# Patient Record
Sex: Female | Born: 1940 | Race: Black or African American | Hispanic: No | Marital: Married | Smoking: Never smoker
Health system: Southern US, Community
[De-identification: ages and names within clinical notes are randomized; demographics above are authoritative.]

## PROBLEM LIST (undated history)

## (undated) DIAGNOSIS — I619 Nontraumatic intracerebral hemorrhage, unspecified: Secondary | ICD-10-CM

## (undated) DIAGNOSIS — I10 Essential (primary) hypertension: Secondary | ICD-10-CM

## (undated) DIAGNOSIS — I4891 Unspecified atrial fibrillation: Secondary | ICD-10-CM

## (undated) HISTORY — PX: HEMORROIDECTOMY: SUR656

## (undated) HISTORY — PX: CARDIOVERSION: SHX1299

## (undated) HISTORY — PX: BREAST SURGERY: SHX581

---

## 2008-11-15 ENCOUNTER — Ambulatory Visit: Payer: Self-pay | Admitting: Diagnostic Radiology

## 2008-11-15 ENCOUNTER — Emergency Department (HOSPITAL_BASED_OUTPATIENT_CLINIC_OR_DEPARTMENT_OTHER): Admission: EM | Admit: 2008-11-15 | Discharge: 2008-11-15 | Payer: Self-pay | Admitting: Emergency Medicine

## 2009-03-13 IMAGING — CR DG HAND COMPLETE 3+V*R*
3 series · 3 of 3 positions shown · non-contrast
Comparison: None

CLINICAL DATA: Dog bite, lacerations, swelling

RIGHT HAND - COMPLETE 3+ VIEW

[x hand pa right]
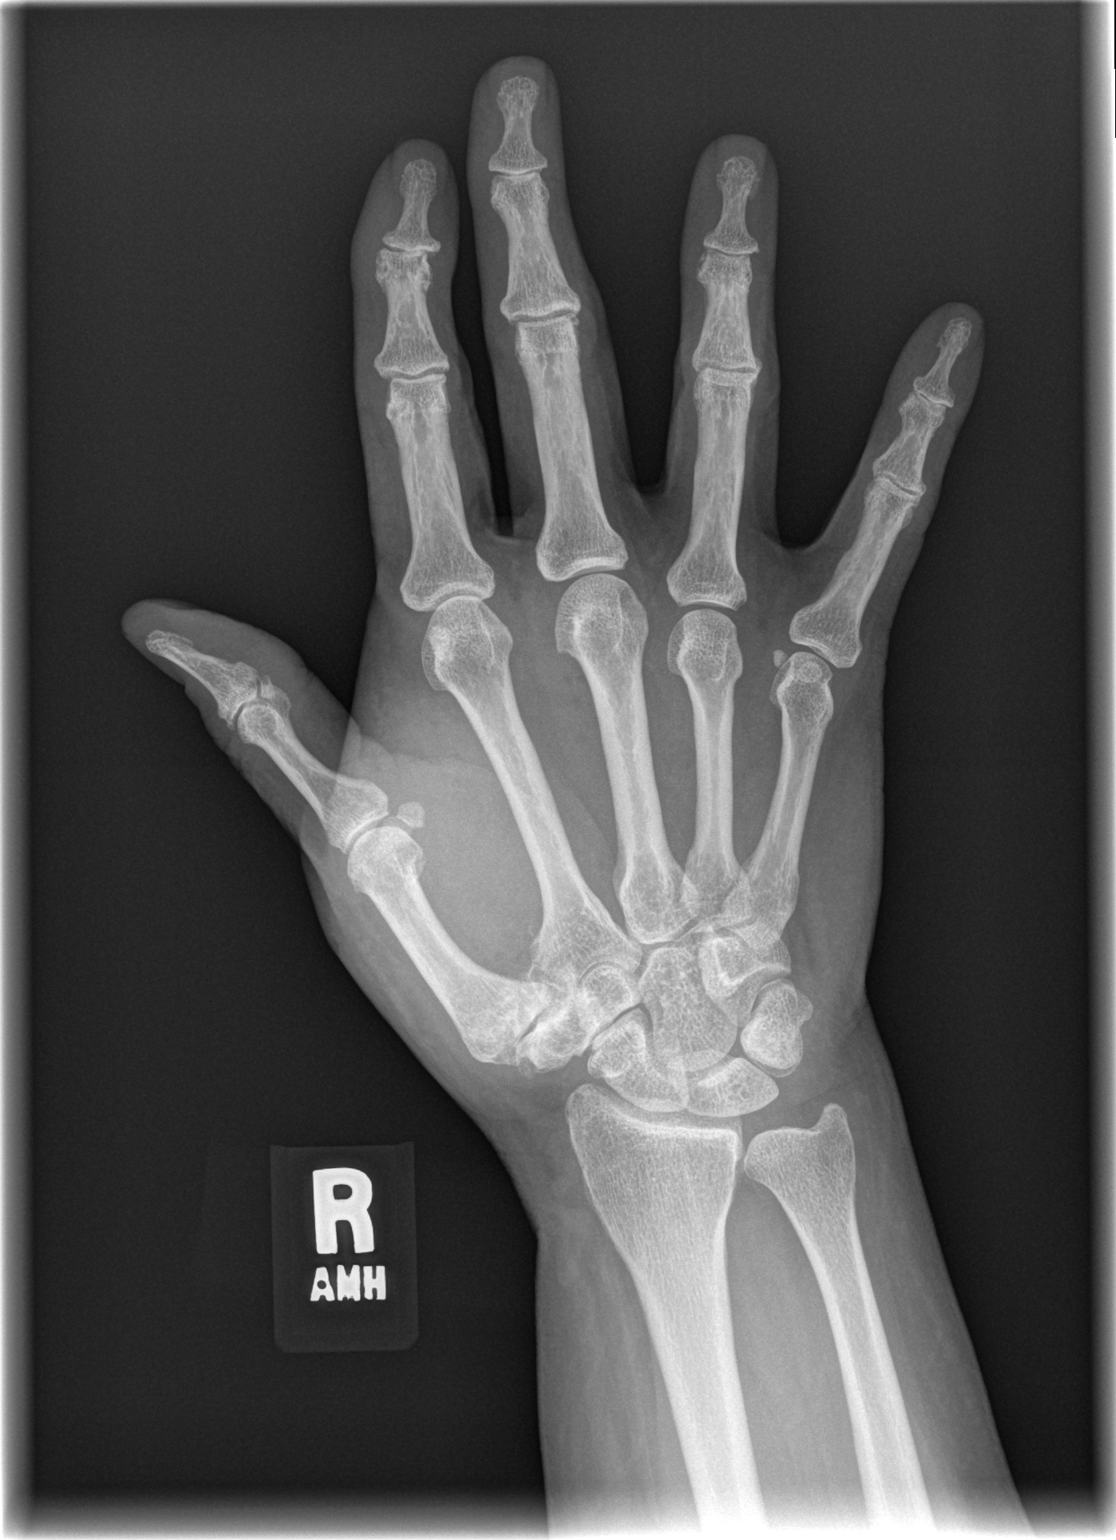

[x hand oblique right]
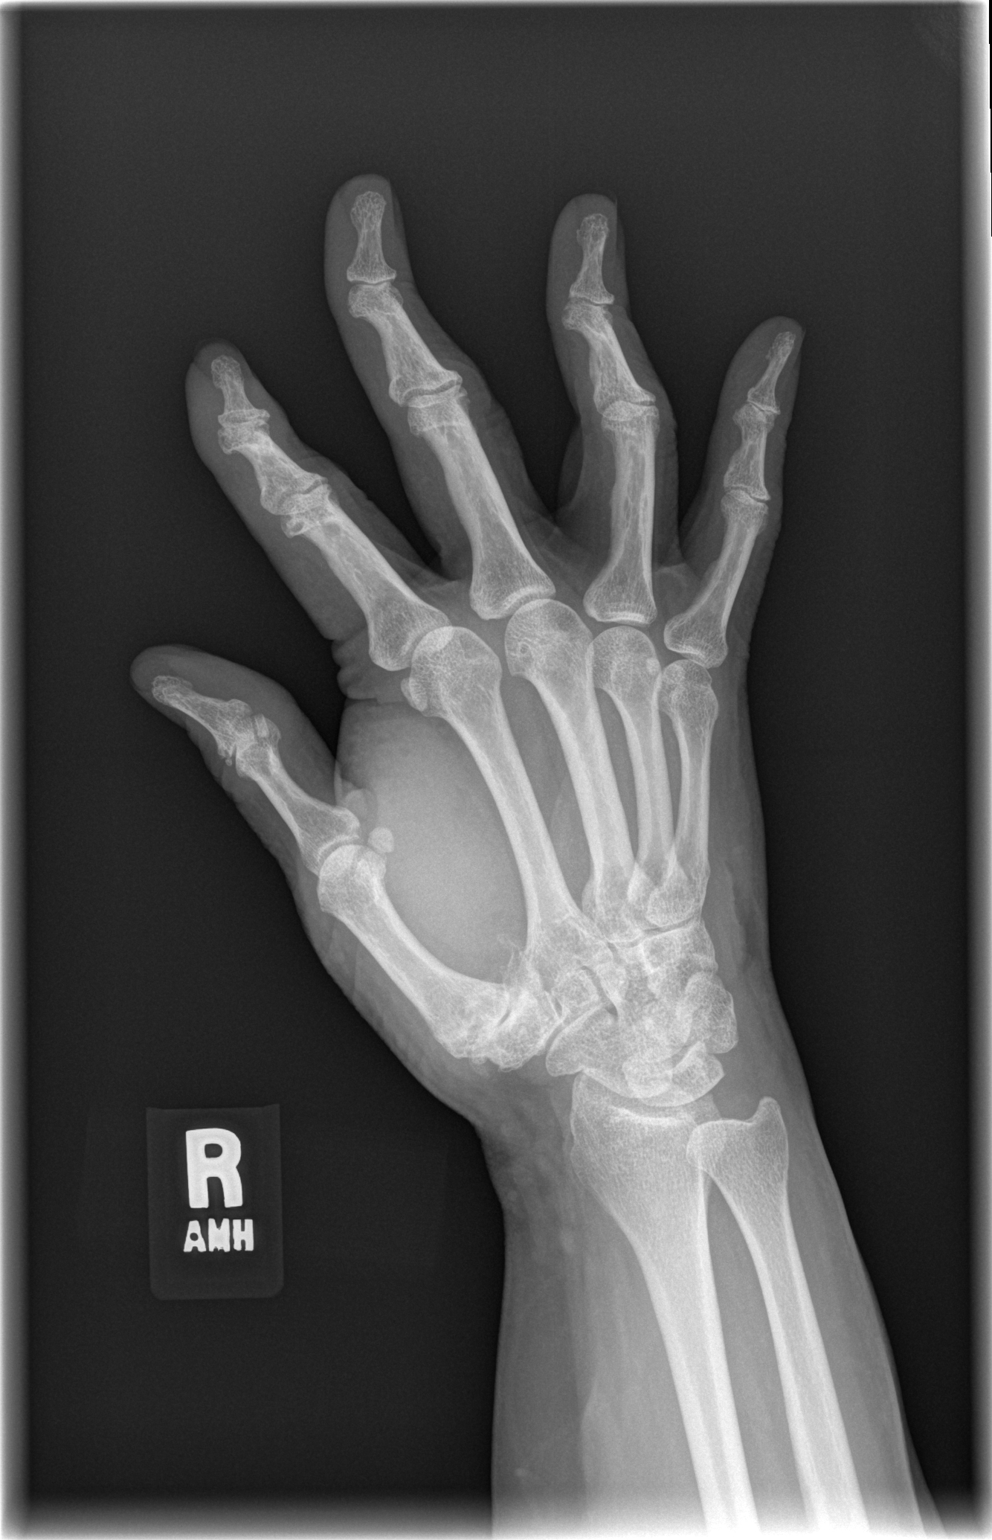

[x hand lat right]
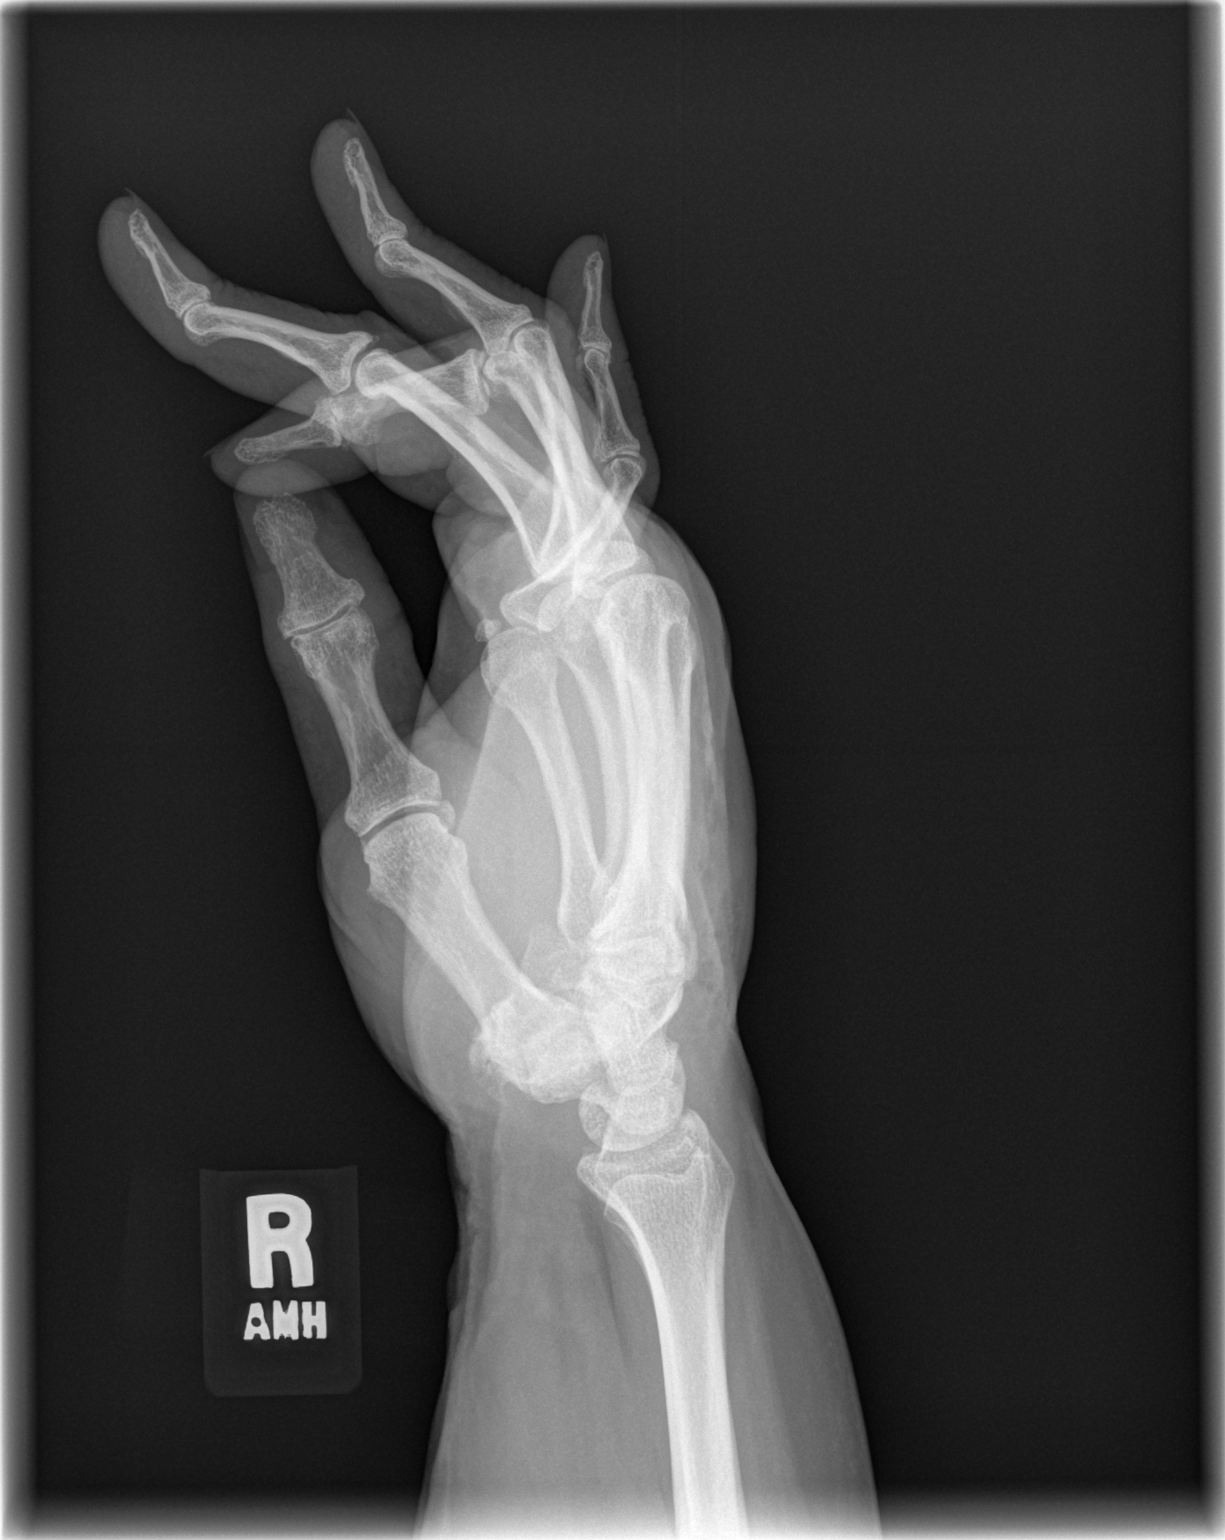

[3 of 3 positions shown; findings below may reference images not displayed]

FINDINGS: Scattered degenerative changes of interphalangeal joints.
Additional degenerative changes of first CMC joint with slight
radial subluxation of first metacarpal.
Slight bony demineralization.
Soft tissue swelling and foci of gas along dorsum of hand at the
level of the proximal metacarpals and CMC joints.
No acute fracture, dislocation, or bone destruction.
IMPRESSION: Soft tissue injuries at dorsum of right hand.
No definite acute bony abnormalities.
Scattered degenerative changes right hand as above.

## 2015-12-03 DIAGNOSIS — I10 Essential (primary) hypertension: Secondary | ICD-10-CM | POA: Diagnosis not present

## 2015-12-08 DIAGNOSIS — E039 Hypothyroidism, unspecified: Secondary | ICD-10-CM | POA: Diagnosis not present

## 2015-12-08 DIAGNOSIS — I4891 Unspecified atrial fibrillation: Secondary | ICD-10-CM | POA: Diagnosis not present

## 2015-12-08 DIAGNOSIS — I10 Essential (primary) hypertension: Secondary | ICD-10-CM | POA: Diagnosis not present

## 2015-12-21 DIAGNOSIS — I4891 Unspecified atrial fibrillation: Secondary | ICD-10-CM | POA: Diagnosis not present

## 2015-12-21 DIAGNOSIS — I495 Sick sinus syndrome: Secondary | ICD-10-CM | POA: Diagnosis not present

## 2015-12-21 DIAGNOSIS — I10 Essential (primary) hypertension: Secondary | ICD-10-CM | POA: Diagnosis not present

## 2015-12-21 DIAGNOSIS — I447 Left bundle-branch block, unspecified: Secondary | ICD-10-CM | POA: Diagnosis not present

## 2015-12-21 DIAGNOSIS — I480 Paroxysmal atrial fibrillation: Secondary | ICD-10-CM | POA: Diagnosis not present

## 2015-12-21 DIAGNOSIS — G4733 Obstructive sleep apnea (adult) (pediatric): Secondary | ICD-10-CM | POA: Diagnosis not present

## 2015-12-21 DIAGNOSIS — I517 Cardiomegaly: Secondary | ICD-10-CM | POA: Diagnosis not present

## 2015-12-21 DIAGNOSIS — Z6839 Body mass index (BMI) 39.0-39.9, adult: Secondary | ICD-10-CM | POA: Diagnosis not present

## 2015-12-22 DIAGNOSIS — G4733 Obstructive sleep apnea (adult) (pediatric): Secondary | ICD-10-CM | POA: Diagnosis not present

## 2015-12-22 DIAGNOSIS — I480 Paroxysmal atrial fibrillation: Secondary | ICD-10-CM | POA: Diagnosis not present

## 2015-12-22 DIAGNOSIS — R42 Dizziness and giddiness: Secondary | ICD-10-CM | POA: Diagnosis not present

## 2015-12-22 DIAGNOSIS — Z79899 Other long term (current) drug therapy: Secondary | ICD-10-CM | POA: Diagnosis not present

## 2015-12-22 DIAGNOSIS — T462X5A Adverse effect of other antidysrhythmic drugs, initial encounter: Secondary | ICD-10-CM | POA: Diagnosis not present

## 2015-12-22 DIAGNOSIS — I10 Essential (primary) hypertension: Secondary | ICD-10-CM | POA: Diagnosis not present

## 2015-12-22 DIAGNOSIS — E039 Hypothyroidism, unspecified: Secondary | ICD-10-CM | POA: Diagnosis not present

## 2015-12-22 DIAGNOSIS — Z6839 Body mass index (BMI) 39.0-39.9, adult: Secondary | ICD-10-CM | POA: Diagnosis not present

## 2015-12-22 DIAGNOSIS — F329 Major depressive disorder, single episode, unspecified: Secondary | ICD-10-CM | POA: Diagnosis not present

## 2015-12-22 DIAGNOSIS — Z9989 Dependence on other enabling machines and devices: Secondary | ICD-10-CM | POA: Diagnosis not present

## 2015-12-22 DIAGNOSIS — I4891 Unspecified atrial fibrillation: Secondary | ICD-10-CM | POA: Diagnosis not present

## 2015-12-22 DIAGNOSIS — I081 Rheumatic disorders of both mitral and tricuspid valves: Secondary | ICD-10-CM | POA: Diagnosis not present

## 2015-12-22 DIAGNOSIS — Z7902 Long term (current) use of antithrombotics/antiplatelets: Secondary | ICD-10-CM | POA: Diagnosis not present

## 2015-12-22 DIAGNOSIS — I517 Cardiomegaly: Secondary | ICD-10-CM | POA: Diagnosis not present

## 2015-12-22 DIAGNOSIS — M1990 Unspecified osteoarthritis, unspecified site: Secondary | ICD-10-CM | POA: Diagnosis not present

## 2015-12-22 DIAGNOSIS — Z881 Allergy status to other antibiotic agents status: Secondary | ICD-10-CM | POA: Diagnosis not present

## 2015-12-22 DIAGNOSIS — R413 Other amnesia: Secondary | ICD-10-CM | POA: Diagnosis not present

## 2015-12-23 DIAGNOSIS — Z6839 Body mass index (BMI) 39.0-39.9, adult: Secondary | ICD-10-CM | POA: Diagnosis not present

## 2015-12-23 DIAGNOSIS — G4733 Obstructive sleep apnea (adult) (pediatric): Secondary | ICD-10-CM | POA: Diagnosis not present

## 2015-12-23 DIAGNOSIS — I480 Paroxysmal atrial fibrillation: Secondary | ICD-10-CM | POA: Diagnosis not present

## 2015-12-23 DIAGNOSIS — I10 Essential (primary) hypertension: Secondary | ICD-10-CM | POA: Diagnosis not present

## 2015-12-24 DIAGNOSIS — Z6841 Body Mass Index (BMI) 40.0 and over, adult: Secondary | ICD-10-CM | POA: Diagnosis not present

## 2015-12-24 DIAGNOSIS — R001 Bradycardia, unspecified: Secondary | ICD-10-CM | POA: Diagnosis not present

## 2015-12-24 DIAGNOSIS — G4733 Obstructive sleep apnea (adult) (pediatric): Secondary | ICD-10-CM | POA: Diagnosis not present

## 2015-12-24 DIAGNOSIS — I4891 Unspecified atrial fibrillation: Secondary | ICD-10-CM | POA: Diagnosis not present

## 2015-12-24 DIAGNOSIS — I10 Essential (primary) hypertension: Secondary | ICD-10-CM | POA: Diagnosis not present

## 2015-12-30 DIAGNOSIS — Z79899 Other long term (current) drug therapy: Secondary | ICD-10-CM | POA: Diagnosis not present

## 2015-12-30 DIAGNOSIS — I10 Essential (primary) hypertension: Secondary | ICD-10-CM | POA: Diagnosis not present

## 2015-12-30 DIAGNOSIS — I4891 Unspecified atrial fibrillation: Secondary | ICD-10-CM | POA: Diagnosis not present

## 2015-12-30 DIAGNOSIS — I480 Paroxysmal atrial fibrillation: Secondary | ICD-10-CM | POA: Diagnosis not present

## 2015-12-30 DIAGNOSIS — G4733 Obstructive sleep apnea (adult) (pediatric): Secondary | ICD-10-CM | POA: Diagnosis not present

## 2015-12-30 DIAGNOSIS — I252 Old myocardial infarction: Secondary | ICD-10-CM | POA: Diagnosis not present

## 2015-12-30 DIAGNOSIS — E039 Hypothyroidism, unspecified: Secondary | ICD-10-CM | POA: Diagnosis not present

## 2016-01-20 DIAGNOSIS — I4891 Unspecified atrial fibrillation: Secondary | ICD-10-CM | POA: Diagnosis not present

## 2016-01-20 DIAGNOSIS — I480 Paroxysmal atrial fibrillation: Secondary | ICD-10-CM | POA: Diagnosis not present

## 2016-01-20 DIAGNOSIS — R002 Palpitations: Secondary | ICD-10-CM | POA: Diagnosis not present

## 2016-01-20 DIAGNOSIS — R05 Cough: Secondary | ICD-10-CM | POA: Diagnosis not present

## 2016-01-20 DIAGNOSIS — I499 Cardiac arrhythmia, unspecified: Secondary | ICD-10-CM | POA: Diagnosis not present

## 2016-01-20 DIAGNOSIS — I10 Essential (primary) hypertension: Secondary | ICD-10-CM | POA: Diagnosis not present

## 2016-02-01 DIAGNOSIS — E02 Subclinical iodine-deficiency hypothyroidism: Secondary | ICD-10-CM | POA: Diagnosis not present

## 2016-02-01 DIAGNOSIS — I10 Essential (primary) hypertension: Secondary | ICD-10-CM | POA: Diagnosis not present

## 2016-02-01 DIAGNOSIS — Z6839 Body mass index (BMI) 39.0-39.9, adult: Secondary | ICD-10-CM | POA: Diagnosis not present

## 2016-02-01 DIAGNOSIS — G4733 Obstructive sleep apnea (adult) (pediatric): Secondary | ICD-10-CM | POA: Diagnosis not present

## 2016-02-01 DIAGNOSIS — I480 Paroxysmal atrial fibrillation: Secondary | ICD-10-CM | POA: Diagnosis not present

## 2016-02-29 DIAGNOSIS — E02 Subclinical iodine-deficiency hypothyroidism: Secondary | ICD-10-CM | POA: Diagnosis not present

## 2016-02-29 DIAGNOSIS — I4891 Unspecified atrial fibrillation: Secondary | ICD-10-CM | POA: Diagnosis not present

## 2016-02-29 DIAGNOSIS — G4733 Obstructive sleep apnea (adult) (pediatric): Secondary | ICD-10-CM | POA: Diagnosis not present

## 2016-02-29 DIAGNOSIS — I10 Essential (primary) hypertension: Secondary | ICD-10-CM | POA: Diagnosis not present

## 2016-02-29 DIAGNOSIS — Z6841 Body Mass Index (BMI) 40.0 and over, adult: Secondary | ICD-10-CM | POA: Diagnosis not present

## 2016-04-14 ENCOUNTER — Emergency Department (HOSPITAL_BASED_OUTPATIENT_CLINIC_OR_DEPARTMENT_OTHER)
Admission: EM | Admit: 2016-04-14 | Discharge: 2016-04-14 | Disposition: A | Discharge status: Home / Self Care | Payer: PPO | Attending: Emergency Medicine | Admitting: Emergency Medicine

## 2016-04-14 ENCOUNTER — Encounter (HOSPITAL_BASED_OUTPATIENT_CLINIC_OR_DEPARTMENT_OTHER): Payer: Self-pay | Admitting: *Deleted

## 2016-04-14 DIAGNOSIS — Z7901 Long term (current) use of anticoagulants: Secondary | ICD-10-CM | POA: Diagnosis not present

## 2016-04-14 DIAGNOSIS — R040 Epistaxis: Secondary | ICD-10-CM | POA: Diagnosis not present

## 2016-04-14 DIAGNOSIS — I10 Essential (primary) hypertension: Secondary | ICD-10-CM | POA: Diagnosis not present

## 2016-04-14 DIAGNOSIS — I4891 Unspecified atrial fibrillation: Secondary | ICD-10-CM | POA: Diagnosis not present

## 2016-04-14 HISTORY — DX: Essential (primary) hypertension: I10

## 2016-04-14 HISTORY — DX: Unspecified atrial fibrillation: I48.91

## 2016-04-14 MED ORDER — OXYMETAZOLINE HCL 0.05 % NA SOLN
NASAL | Status: AC
  Administered 2016-04-14: 1 via NASAL
  Filled 2016-04-14: qty 15

## 2016-04-14 MED ORDER — LIDOCAINE HCL (PF) 1 % IJ SOLN
INTRAMUSCULAR | Status: AC
  Administered 2016-04-14: 5 mL
  Filled 2016-04-14: qty 5

## 2016-04-14 MED ORDER — OXYMETAZOLINE HCL 0.05 % NA SOLN
1.0000 | Freq: Once | NASAL | Status: AC
  Administered 2016-04-14: 1 via NASAL

## 2016-04-14 MED ORDER — LIDOCAINE HCL (PF) 1 % IJ SOLN
5.0000 mL | Freq: Once | INTRAMUSCULAR | Status: AC
  Administered 2016-04-14: 5 mL

## 2016-04-14 NOTE — ED Notes (Signed)
MD at bedside. 

## 2016-04-14 NOTE — ED Notes (Signed)
Pt. Unsure of dosage on her meds at present time and of the names of drugs she has allergies to.

## 2016-04-14 NOTE — ED Notes (Signed)
Pt. Reports she had a nose bleed on Wednesday  For approx. 45 mins.     Pt. Reports being on blood thinners and has history of HTN  Pt. History of A Fib and rate is usually 130s

## 2016-04-14 NOTE — Discharge Instructions (Signed)
° °

## 2016-04-14 NOTE — ED Notes (Signed)
Patient in with nose bleed

## 2016-04-14 NOTE — ED Provider Notes (Signed)
CSN: [id number]     Arrival date & time 04/14/16  1554 History   First MD Initiated Contact with Patient 04/14/16 1559     Chief Complaint  Patient presents with  . Epistaxis     (Consider location/radiation/quality/duration/timing/severity/associated sxs/prior Treatment) HPI Patient reports she has been having a bloody nose intermittently for the past week. She reports sometimes it will bleed for 20 minutes at a time. She can usually get it to stop with compression. It has predominantly been bleeding out of the right side. She reports sometimes she can feel a running over into the left. No pain. She does use a nebulizer machine and thought that maybe that has been aggravating things. No headache. She reports her blood pressures have been fairly well-controlled. She is on blood thinners for atrial fibrillation. Past Medical History  Diagnosis Date  . Hypertension   . Atrial fibrillation [facility])    Past Surgical History  Procedure Laterality Date  . Cardioversion    . Breast surgery      breast biopsy  . Hemorroidectomy     No family history on file. Social History  Substance Use Topics  . Smoking status: Never Smoker   . Smokeless tobacco: None  . Alcohol Use: No   OB History    No data available     Review of Systems 10 Systems reviewed and are negative for acute change except as noted in the HPI.    Allergies  Ceftin  Home Medications   Prior to Admission medications   Medication Sig Start Date End Date Taking? Authorizing Provider  levothyroxine (SYNTHROID, LEVOTHROID) 112 MCG tablet Take 112 mcg by mouth daily before breakfast.   Yes Historical Provider, MD  metoprolol succinate (TOPROL-XL) 25 MG 24 hr tablet Take 25 mg by mouth daily.   Yes Historical Provider, MD  rivaroxaban (XARELTO) 10 MG TABS tablet Take 10 mg by mouth daily.   Yes Historical Provider, MD   BP 164/92 mmHg  Pulse 103  Temp(Src) 98 F (36.7 C) (Oral)  Resp 22  Ht _0  (1.626 m)  Wt 230  lb (104.327 kg)  BMI 39.46 kg/m2  SpO2 100% Physical Exam  Constitutional: She is oriented to person, place, and time.  Moderately obese, alert and nontoxic. No respiratory distress.  HENT:  Head: Normocephalic and atraumatic.  Upon arrival right near has a large clot present within it. Left near small trickle of blood. Posterior oropharynx has blood streaked mucus. Posterior oropharynx widely patent.  After application of Neo-Synephrine and lidocaine and blowing of the clots, reexamination shows area of slow bleeding and erosion at Kiesselbach plexus in the right septum. Posteriorly the nares is clear without bleeding. Left naris clear.  Eyes: EOM are normal.  Pulmonary/Chest: Effort normal.  Musculoskeletal: Normal range of motion.  Neurological: She is alert and oriented to person, place, and time. She exhibits normal muscle tone. Coordination normal.    ED Course  .Epistaxis Management Date/Time: 04/14/2016 5:20 PM Performed by: Charlesetta Shanks Authorized by: Charlesetta Shanks Consent: Verbal consent obtained. Local anesthetic: lidocaine 1% without epinephrine Patient sedated: no Treatment site: right anterior Repair method: anterior pack Post-procedure assessment: bleeding stopped Treatment complexity: complex Comments: Patient had large clot that was first cleared by blowing. Subsequently pledgets soaked with Neo-Synephrine and 1% lidocaine placed bilaterally. These were allowed to sit for approximately 10 minutes. Once removed, there was an evident bleeding spot on the right Kiesselbach plexus. Surgicel placed on the bleeding site and anterior packing done  with Merocel sponges.   (including critical care time)  Labs Review Labs Reviewed - No data to display  Imaging Review No results found. I have personally reviewed and evaluated these images and lab results as part of my medical decision-making.   EKG Interpretation None      MDM   Final diagnoses:  Anterior  epistaxis  Chronic anticoagulation   Patient is on Xarelto. She has had intermittent nosebleed for week. Today it became more profuse with large clots and bleeding bilaterally. Treated as outlined above. After treatment with lidocaine and Neo-Synephrine, bleeding site is identified to be anterior Kiesselbach plexus. Bleeding controlled by application of Surgicel and anterior packing.    Charlesetta Shanks, MD 04/14/16 [phone]

## 2016-04-17 ENCOUNTER — Encounter (HOSPITAL_BASED_OUTPATIENT_CLINIC_OR_DEPARTMENT_OTHER): Payer: Self-pay | Admitting: Emergency Medicine

## 2016-04-17 ENCOUNTER — Emergency Department (HOSPITAL_BASED_OUTPATIENT_CLINIC_OR_DEPARTMENT_OTHER)
Admission: EM | Admit: 2016-04-17 | Discharge: 2016-04-17 | Disposition: A | Discharge status: Home / Self Care | Payer: PPO | Attending: Emergency Medicine | Admitting: Emergency Medicine

## 2016-04-17 DIAGNOSIS — K2970 Gastritis, unspecified, without bleeding: Secondary | ICD-10-CM

## 2016-04-17 DIAGNOSIS — I10 Essential (primary) hypertension: Secondary | ICD-10-CM | POA: Insufficient documentation

## 2016-04-17 DIAGNOSIS — Z4800 Encounter for change or removal of nonsurgical wound dressing: Secondary | ICD-10-CM | POA: Insufficient documentation

## 2016-04-17 DIAGNOSIS — I4891 Unspecified atrial fibrillation: Secondary | ICD-10-CM | POA: Insufficient documentation

## 2016-04-17 DIAGNOSIS — Z79899 Other long term (current) drug therapy: Secondary | ICD-10-CM | POA: Diagnosis not present

## 2016-04-17 HISTORY — DX: Nontraumatic intracerebral hemorrhage, unspecified: I61.9

## 2016-04-17 LAB — COMPREHENSIVE METABOLIC PANEL
ALK PHOS: 83 U/L (ref 38–126)
ALT: 11 U/L — ABNORMAL LOW (ref 14–54)
ANION GAP: 7 (ref 5–15)
AST: 22 U/L (ref 15–41)
Albumin: 3.8 g/dL (ref 3.5–5.0)
BILIRUBIN TOTAL: 0.5 mg/dL (ref 0.3–1.2)
BUN: 15 mg/dL (ref 6–20)
CALCIUM: 8.8 mg/dL — AB (ref 8.9–10.3)
CO2: 25 mmol/L (ref 22–32)
Chloride: 109 mmol/L (ref 101–111)
Creatinine, Ser: 1.07 mg/dL — ABNORMAL HIGH (ref 0.44–1.00)
GFR calc non Af Amer: 50 mL/min — ABNORMAL LOW (ref >60)
GFR, EST AFRICAN AMERICAN: 58 mL/min — AB (ref >60)
Glucose, Bld: 93 mg/dL (ref 65–99)
POTASSIUM: 3.8 mmol/L (ref 3.5–5.1)
Sodium: 141 mmol/L (ref 135–145)
TOTAL PROTEIN: 6.9 g/dL (ref 6.5–8.1)

## 2016-04-17 LAB — CBC WITH DIFFERENTIAL/PLATELET
BASOS PCT: 0 %
Basophils Absolute: 0 10*3/uL (ref 0.0–0.1)
EOS ABS: 0.1 10*3/uL (ref 0.0–0.7)
Eosinophils Relative: 2 %
HEMATOCRIT: 37.1 % (ref 36.0–46.0)
HEMOGLOBIN: 12.2 g/dL (ref 12.0–15.0)
LYMPHS ABS: 2 10*3/uL (ref 0.7–4.0)
Lymphocytes Relative: 28 %
MCH: 29.2 pg (ref 26.0–34.0)
MCHC: 32.9 g/dL (ref 30.0–36.0)
MCV: 88.8 fL (ref 78.0–100.0)
MONO ABS: 0.7 10*3/uL (ref 0.1–1.0)
MONOS PCT: 9 %
NEUTROS PCT: 61 %
Neutro Abs: 4.4 10*3/uL (ref 1.7–7.7)
Platelets: 228 10*3/uL (ref 150–400)
RBC: 4.18 MIL/uL (ref 3.87–5.11)
RDW: 15 % (ref 11.5–15.5)
WBC: 7.2 10*3/uL (ref 4.0–10.5)

## 2016-04-17 LAB — LIPASE, BLOOD: LIPASE: 14 U/L (ref 11–51)

## 2016-04-17 LAB — OCCULT BLOOD X 1 CARD TO LAB, STOOL: FECAL OCCULT BLD: NEGATIVE

## 2016-04-17 MED ORDER — LANSOPRAZOLE 30 MG PO CPDR: 30.0000 mg | DELAYED_RELEASE_CAPSULE | Freq: Every day | ORAL | Status: AC

## 2016-04-17 MED ORDER — GI COCKTAIL ~~LOC~~
30.0000 mL | Freq: Once | ORAL | Status: DC
Start: 2016-04-17 — End: 2016-04-17
  Filled 2016-04-17: qty 30

## 2016-04-17 NOTE — ED Provider Notes (Signed)
CSN: [id number]     Arrival date & time 04/17/16  0827 History   First MD Initiated Contact with Patient 04/17/16 [phone]     Chief Complaint  Patient presents with  . Nose Problem     (Consider location/radiation/quality/duration/timing/severity/associated sxs/prior Treatment) HPI Patient was seen in the emergency department on Friday for anterior epistaxis. Nose was anteriorly packed. She's had no bleeding since. States she stopped her for 2 days but took a dose yesterday. She's had some mild "indigestion" in the epigastric region. She also states she's had some gross red blood in her stool. Has a history of hemorrhoids. Denies dizziness or syncope. No vomiting or diarrhea. Past Medical History  Diagnosis Date  . Hypertension   . Atrial fibrillation ([place])   . 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 Cardioversion    . Breast surgery      breast biopsy  . Hemorroidectomy     No family history on file. Social History  Substance Use Topics  . Smoking status: Never Smoker   . Smokeless tobacco: None  . Alcohol Use: No   OB History    No data available     Review of Systems  Constitutional: Negative for fever and chills.  HENT: Negative for nosebleeds.   Gastrointestinal: Positive for abdominal pain and blood in stool. Negative for nausea, vomiting, diarrhea and constipation.  Musculoskeletal: Negative for back pain and neck pain.  Skin: Negative for rash and wound.  Neurological: Negative for dizziness, syncope, weakness and numbness.  All other systems reviewed and are negative.     Allergies  Ceftin  Home Medications   Prior to Admission medications   Medication Sig Start Date End Date Taking? Authorizing Provider  diltiazem (CARDIZEM CD) 180 MG 24 hr capsule Take 180 mg by mouth daily.   Yes Historical Provider, MD  levothyroxine (SYNTHROID, LEVOTHROID) 88 MCG tablet Take 88 mcg by mouth daily before breakfast.   Yes  Historical Provider, MD  losartan (COZAAR) 100 MG tablet Take 100 mg by mouth daily.   Yes Historical Provider, MD  lansoprazole (PREVACID) 30 MG capsule Take 1 capsule (30 mg total) by mouth daily at 12 noon. 04/17/16   Julianne Rice, MD  metoprolol succinate (TOPROL-XL) 25 MG 24 hr tablet Take 25 mg by mouth daily.    Historical Provider, MD  rivaroxaban (XARELTO) 10 MG TABS tablet Take 20 mg by mouth daily.     Historical Provider, MD   BP 146/103 mmHg  Pulse 86  Temp(Src) 97.9 F (36.6 C) (Oral)  Resp 18  Ht _0  (1.626 m)  Wt 230 lb (104.327 kg)  BMI 39.46 kg/m2  SpO2 96% Physical Exam  Constitutional: She is oriented to person, place, and time. She appears well-developed and well-nourished. No distress.  HENT:  Head: Normocephalic and atraumatic.  Mouth/Throat: Oropharynx is clear and moist.  Anterior packing removed. No active bleeding  Eyes: EOM are normal. Pupils are equal, round, and reactive to light.  Neck: Normal range of motion. Neck supple.  Cardiovascular: Normal rate and regular rhythm.   Pulmonary/Chest: Effort normal and breath sounds normal. No respiratory distress. She has no wheezes. She has no rales. She exhibits no tenderness.  Abdominal: Soft. Bowel sounds are normal. She exhibits no distension and no mass. There is tenderness (mild epigastric tenderness with palpation.). There is no rebound and no guarding.  Genitourinary:  Brown stool in rectum. No obvious hemorrhoids. No gross blood  Musculoskeletal: Normal range of motion. She exhibits no edema or tenderness.  Neurological: She is alert and oriented to person, place, and time.  Moves all extremities without deficit. Sensation is fully intact.  Skin: Skin is warm and dry. No rash noted. No erythema.  Psychiatric: She has a normal mood and affect. Her behavior is normal.  Nursing note and vitals reviewed.   ED Course  Procedures (including critical care time) Labs Review Labs Reviewed   COMPREHENSIVE METABOLIC PANEL - Abnormal; Notable for the following:    Creatinine, Ser 1.07 (*)    Calcium 8.8 (*)    ALT 11 (*)    GFR calc non Af Amer 50 (*)    GFR calc Af Amer 58 (*)    All other components within normal limits  CBC WITH DIFFERENTIAL/PLATELET  LIPASE, BLOOD  OCCULT BLOOD X 1 CARD TO LAB, STOOL    Imaging Review No results found. I have personally reviewed and evaluated these images and lab results as part of my medical decision-making.   EKG Interpretation None      MDM   Final diagnoses:  Encounter for removal of nasal packing  Gastritis    Labs are normal. Guaiac negative. Likely gastritis. We'll start on PPI. Patient will need follow-up with gastroenterology. Patient's had no further epistaxis in the emergency department. We'll discharge home with return precautions.    Julianne Rice, MD 04/17/16 1025

## 2016-04-17 NOTE — Discharge Instructions (Signed)
Nosebleed Nosebleeds are common. They are due to a crack in the inside lining of your nose (mucous membrane) or from a small blood vessel that starts to bleed. Nosebleeds can be caused by many conditions, such as injury, infections, dry mucous membranes or dry climate, medicines, nose picking, and home heating and cooling systems. Most nosebleeds come from blood vessels in the front of your nose. HOME CARE INSTRUCTIONS   Try controlling your nosebleed by pinching your nostrils gently and continuously for at least 10 minutes.  Avoid blowing or sniffing your nose for a number of hours after having a nosebleed.  Do not put gauze inside your nose yourself. If your nose was packed by your health care provider, try to maintain the pack inside of your nose until your health care provider removes it.  If a gauze pack was used and it starts to fall out, gently replace it or cut off the end of it.  If a balloon catheter was used to pack your nose, do not cut or remove it unless your health care provider has instructed you to do that.  Avoid lying down while you are having a nosebleed. Sit up and lean forward.  Use a nasal spray decongestant to help with a nosebleed as directed by your health care provider.  Do not use petroleum jelly or mineral oil in your nose. These can drip into your lungs.  Maintain humidity in your home by using less air conditioning or by using a humidifier.  Aspirinand blood thinners make bleeding more likely. If you are prescribed these medicines and you suffer from nosebleeds, ask your health care provider if you should stop taking the medicines or adjust the dose. Do not stop medicines unless directed by your health care provider  Resume your normal activities as you are able, but avoid straining, lifting, or bending at the waist for several days.  If your nosebleed was caused by dry mucous membranes, use over-the-counter saline nasal spray or gel. This will keep the  mucous membranes moist and allow them to heal. If you must use a lubricant, choose the water-soluble variety. Use it only sparingly, and do not use it within several hours of lying down.  Keep all follow-up visits as directed by your health care provider. This is important. SEEK MEDICAL CARE IF:  You have a fever.  You get frequent nosebleeds.  You are getting nosebleeds more often. SEEK IMMEDIATE MEDICAL CARE IF:  Your nosebleed lasts longer than 20 minutes.  Your nosebleed occurs after an injury to your face, and your nose looks crooked or broken.  You have unusual bleeding from other parts of your body.  You have unusual bruising on other parts of your body.  You feel light-headed or you faint.  You become sweaty.  You vomit blood.  Your nosebleed occurs after a head injury.   This information is not intended to replace advice given to you by your health care provider. Make sure you discuss any questions you have with your health care provider.   Document Released: 07/26/2005 Document Revised: 11/06/2014 Document Reviewed: 06/01/2014 Elsevier Interactive Patient Education 2016 Elsevier Inc.   Gastritis, Adult Gastritis is soreness and swelling (inflammation) of the lining of the stomach. Gastritis can develop as a sudden onset (acute) or long-term (chronic) condition. If gastritis is not treated, it can lead to stomach bleeding and ulcers. CAUSES  Gastritis occurs when the stomach lining is weak or damaged. Digestive juices from the stomach then inflame the  weakened stomach lining. The stomach lining may be weak or damaged due to viral or bacterial infections. One common bacterial infection is the Helicobacter pylori infection. Gastritis can also result from excessive alcohol consumption, taking certain medicines, or having too much acid in the stomach.  SYMPTOMS  In some cases, there are no symptoms. When symptoms are present, they may include:  Pain or a burning  sensation in the upper abdomen.  Nausea.  Vomiting.  An uncomfortable feeling of fullness after eating. DIAGNOSIS  Your caregiver may suspect you have gastritis based on your symptoms and a physical exam. To determine the cause of your gastritis, your caregiver may perform the following:  Blood or stool tests to check for the H pylori bacterium.  Gastroscopy. A thin, flexible tube (endoscope) is passed down the esophagus and into the stomach. The endoscope has a light and camera on the end. Your caregiver uses the endoscope to view the inside of the stomach.  Taking a tissue sample (biopsy) from the stomach to examine under a microscope. TREATMENT  Depending on the cause of your gastritis, medicines may be prescribed. If you have a bacterial infection, such as an H pylori infection, antibiotics may be given. If your gastritis is caused by too much acid in the stomach, H2 blockers or antacids may be given. Your caregiver may recommend that you stop taking aspirin, ibuprofen, or other nonsteroidal anti-inflammatory drugs (NSAIDs). HOME CARE INSTRUCTIONS  Only take over-the-counter or prescription medicines as directed by your caregiver.  If you were given antibiotic medicines, take them as directed. Finish them even if you start to feel better.  Drink enough fluids to keep your urine clear or pale yellow.  Avoid foods and drinks that make your symptoms worse, such as:  Caffeine or alcoholic drinks.  Chocolate.  Peppermint or mint flavorings.  Garlic and onions.  Spicy foods.  Citrus fruits, such as oranges, lemons, or limes.  Tomato-based foods such as sauce, chili, salsa, and pizza.  Fried and fatty foods.  Eat small, frequent meals instead of large meals. SEEK IMMEDIATE MEDICAL CARE IF:   You have black or dark red stools.  You vomit blood or material that looks like coffee grounds.  You are unable to keep fluids down.  Your abdominal pain gets worse.  You have a  fever.  You do not feel better after 1 week.  You have any other questions or concerns. MAKE SURE YOU:  Understand these instructions.  Will watch your condition.  Will get help right away if you are not doing well or get worse.   This information is not intended to replace advice given to you by your health care provider. Make sure you discuss any questions you have with your health care provider.   Document Released: 10/10/2001 Document Revised: 04/16/2012 Document Reviewed: 11/29/2011 Elsevier Interactive Patient Education [employer].

## 2016-04-17 NOTE — ED Notes (Signed)
MD at bedside for rectal exam with chaperone present

## 2016-04-17 NOTE — ED Notes (Signed)
Pt here for nasal packing removal.  No issues since Friday.

## 2016-05-12 DIAGNOSIS — R040 Epistaxis: Secondary | ICD-10-CM | POA: Diagnosis not present

## 2016-06-07 DIAGNOSIS — E02 Subclinical iodine-deficiency hypothyroidism: Secondary | ICD-10-CM | POA: Diagnosis not present

## 2016-06-07 DIAGNOSIS — G4733 Obstructive sleep apnea (adult) (pediatric): Secondary | ICD-10-CM | POA: Diagnosis not present

## 2016-06-07 DIAGNOSIS — G4730 Sleep apnea, unspecified: Secondary | ICD-10-CM | POA: Diagnosis not present

## 2016-06-07 DIAGNOSIS — Z6841 Body Mass Index (BMI) 40.0 and over, adult: Secondary | ICD-10-CM | POA: Diagnosis not present

## 2016-06-07 DIAGNOSIS — I480 Paroxysmal atrial fibrillation: Secondary | ICD-10-CM | POA: Diagnosis not present

## 2016-06-07 DIAGNOSIS — I10 Essential (primary) hypertension: Secondary | ICD-10-CM | POA: Diagnosis not present

## 2016-07-04 DIAGNOSIS — Z0000 Encounter for general adult medical examination without abnormal findings: Secondary | ICD-10-CM | POA: Diagnosis not present

## 2016-07-20 DIAGNOSIS — H2513 Age-related nuclear cataract, bilateral: Secondary | ICD-10-CM | POA: Diagnosis not present

## 2016-07-20 DIAGNOSIS — H5211 Myopia, right eye: Secondary | ICD-10-CM | POA: Diagnosis not present

## 2016-07-20 DIAGNOSIS — H35033 Hypertensive retinopathy, bilateral: Secondary | ICD-10-CM | POA: Diagnosis not present

## 2016-07-20 DIAGNOSIS — H524 Presbyopia: Secondary | ICD-10-CM | POA: Diagnosis not present

## 2016-07-20 DIAGNOSIS — H40013 Open angle with borderline findings, low risk, bilateral: Secondary | ICD-10-CM | POA: Diagnosis not present

## 2016-07-20 DIAGNOSIS — H53453 Other localized visual field defect, bilateral: Secondary | ICD-10-CM | POA: Diagnosis not present

## 2016-07-20 DIAGNOSIS — H04123 Dry eye syndrome of bilateral lacrimal glands: Secondary | ICD-10-CM | POA: Diagnosis not present

## 2016-07-20 DIAGNOSIS — H52222 Regular astigmatism, left eye: Secondary | ICD-10-CM | POA: Diagnosis not present

## 2016-08-01 DIAGNOSIS — G4733 Obstructive sleep apnea (adult) (pediatric): Secondary | ICD-10-CM | POA: Diagnosis not present

## 2016-08-01 DIAGNOSIS — Z9989 Dependence on other enabling machines and devices: Secondary | ICD-10-CM | POA: Diagnosis not present

## 2016-08-14 DIAGNOSIS — Z1231 Encounter for screening mammogram for malignant neoplasm of breast: Secondary | ICD-10-CM | POA: Diagnosis not present

## 2016-08-17 DIAGNOSIS — G4733 Obstructive sleep apnea (adult) (pediatric): Secondary | ICD-10-CM | POA: Diagnosis not present

## 2016-09-13 DIAGNOSIS — R600 Localized edema: Secondary | ICD-10-CM | POA: Diagnosis not present

## 2016-09-13 DIAGNOSIS — I480 Paroxysmal atrial fibrillation: Secondary | ICD-10-CM | POA: Diagnosis not present

## 2016-09-13 DIAGNOSIS — I10 Essential (primary) hypertension: Secondary | ICD-10-CM | POA: Diagnosis not present

## 2016-09-13 DIAGNOSIS — Z6841 Body Mass Index (BMI) 40.0 and over, adult: Secondary | ICD-10-CM | POA: Diagnosis not present

## 2016-09-13 DIAGNOSIS — R05 Cough: Secondary | ICD-10-CM | POA: Diagnosis not present

## 2016-09-13 DIAGNOSIS — Z23 Encounter for immunization: Secondary | ICD-10-CM | POA: Diagnosis not present

## 2016-09-13 DIAGNOSIS — E039 Hypothyroidism, unspecified: Secondary | ICD-10-CM | POA: Diagnosis not present

## 2016-09-13 DIAGNOSIS — G4730 Sleep apnea, unspecified: Secondary | ICD-10-CM | POA: Diagnosis not present

## 2016-11-20 DIAGNOSIS — I481 Persistent atrial fibrillation: Secondary | ICD-10-CM | POA: Diagnosis not present

## 2016-11-20 DIAGNOSIS — Z6841 Body Mass Index (BMI) 40.0 and over, adult: Secondary | ICD-10-CM | POA: Diagnosis not present

## 2016-11-20 DIAGNOSIS — I10 Essential (primary) hypertension: Secondary | ICD-10-CM | POA: Diagnosis not present

## 2016-11-20 DIAGNOSIS — E02 Subclinical iodine-deficiency hypothyroidism: Secondary | ICD-10-CM | POA: Diagnosis not present

## 2016-11-20 DIAGNOSIS — G4733 Obstructive sleep apnea (adult) (pediatric): Secondary | ICD-10-CM | POA: Diagnosis not present

## 2016-11-27 DIAGNOSIS — G4733 Obstructive sleep apnea (adult) (pediatric): Secondary | ICD-10-CM | POA: Diagnosis not present

## 2016-12-27 DIAGNOSIS — G4733 Obstructive sleep apnea (adult) (pediatric): Secondary | ICD-10-CM | POA: Diagnosis not present

## 2017-01-25 DIAGNOSIS — G4733 Obstructive sleep apnea (adult) (pediatric): Secondary | ICD-10-CM | POA: Diagnosis not present

## 2017-02-05 DIAGNOSIS — G4733 Obstructive sleep apnea (adult) (pediatric): Secondary | ICD-10-CM | POA: Diagnosis not present

## 2017-02-05 DIAGNOSIS — Z9989 Dependence on other enabling machines and devices: Secondary | ICD-10-CM | POA: Diagnosis not present

## 2017-02-08 DIAGNOSIS — I10 Essential (primary) hypertension: Secondary | ICD-10-CM | POA: Diagnosis not present

## 2017-02-08 DIAGNOSIS — E559 Vitamin D deficiency, unspecified: Secondary | ICD-10-CM | POA: Diagnosis not present

## 2017-02-08 DIAGNOSIS — I480 Paroxysmal atrial fibrillation: Secondary | ICD-10-CM | POA: Diagnosis not present

## 2017-02-08 DIAGNOSIS — E039 Hypothyroidism, unspecified: Secondary | ICD-10-CM | POA: Diagnosis not present

## 2017-02-09 DIAGNOSIS — G4733 Obstructive sleep apnea (adult) (pediatric): Secondary | ICD-10-CM | POA: Diagnosis not present

## 2017-02-25 DIAGNOSIS — G4733 Obstructive sleep apnea (adult) (pediatric): Secondary | ICD-10-CM | POA: Diagnosis not present

## 2017-02-26 DIAGNOSIS — I10 Essential (primary) hypertension: Secondary | ICD-10-CM | POA: Diagnosis not present

## 2017-02-26 DIAGNOSIS — I481 Persistent atrial fibrillation: Secondary | ICD-10-CM | POA: Diagnosis not present

## 2017-02-26 DIAGNOSIS — Z6841 Body Mass Index (BMI) 40.0 and over, adult: Secondary | ICD-10-CM | POA: Diagnosis not present

## 2017-02-26 DIAGNOSIS — G4733 Obstructive sleep apnea (adult) (pediatric): Secondary | ICD-10-CM | POA: Diagnosis not present

## 2017-03-16 DIAGNOSIS — I517 Cardiomegaly: Secondary | ICD-10-CM | POA: Diagnosis not present

## 2017-03-16 DIAGNOSIS — I4892 Unspecified atrial flutter: Secondary | ICD-10-CM | POA: Diagnosis not present

## 2017-03-16 DIAGNOSIS — I4891 Unspecified atrial fibrillation: Secondary | ICD-10-CM | POA: Diagnosis not present

## 2017-03-16 DIAGNOSIS — Z01818 Encounter for other preprocedural examination: Secondary | ICD-10-CM | POA: Diagnosis not present

## 2017-03-16 DIAGNOSIS — I081 Rheumatic disorders of both mitral and tricuspid valves: Secondary | ICD-10-CM | POA: Diagnosis not present

## 2017-03-19 DIAGNOSIS — I10 Essential (primary) hypertension: Secondary | ICD-10-CM | POA: Diagnosis not present

## 2017-03-19 DIAGNOSIS — I481 Persistent atrial fibrillation: Secondary | ICD-10-CM | POA: Diagnosis not present

## 2017-03-19 DIAGNOSIS — I440 Atrioventricular block, first degree: Secondary | ICD-10-CM | POA: Diagnosis not present

## 2017-03-19 DIAGNOSIS — Z6841 Body Mass Index (BMI) 40.0 and over, adult: Secondary | ICD-10-CM | POA: Diagnosis not present

## 2017-03-19 DIAGNOSIS — J9601 Acute respiratory failure with hypoxia: Secondary | ICD-10-CM | POA: Diagnosis not present

## 2017-03-19 DIAGNOSIS — J9611 Chronic respiratory failure with hypoxia: Secondary | ICD-10-CM | POA: Diagnosis not present

## 2017-03-19 DIAGNOSIS — I361 Nonrheumatic tricuspid (valve) insufficiency: Secondary | ICD-10-CM | POA: Diagnosis not present

## 2017-03-19 DIAGNOSIS — Z8249 Family history of ischemic heart disease and other diseases of the circulatory system: Secondary | ICD-10-CM | POA: Diagnosis not present

## 2017-03-19 DIAGNOSIS — E669 Obesity, unspecified: Secondary | ICD-10-CM | POA: Diagnosis not present

## 2017-03-19 DIAGNOSIS — I2699 Other pulmonary embolism without acute cor pulmonale: Secondary | ICD-10-CM | POA: Diagnosis not present

## 2017-03-19 DIAGNOSIS — I480 Paroxysmal atrial fibrillation: Secondary | ICD-10-CM | POA: Diagnosis not present

## 2017-03-19 DIAGNOSIS — I4891 Unspecified atrial fibrillation: Secondary | ICD-10-CM | POA: Diagnosis not present

## 2017-03-19 DIAGNOSIS — R7303 Prediabetes: Secondary | ICD-10-CM | POA: Diagnosis not present

## 2017-03-19 DIAGNOSIS — E876 Hypokalemia: Secondary | ICD-10-CM | POA: Diagnosis not present

## 2017-03-19 DIAGNOSIS — R0602 Shortness of breath: Secondary | ICD-10-CM | POA: Diagnosis not present

## 2017-03-19 DIAGNOSIS — J9811 Atelectasis: Secondary | ICD-10-CM | POA: Diagnosis not present

## 2017-03-19 DIAGNOSIS — Z7901 Long term (current) use of anticoagulants: Secondary | ICD-10-CM | POA: Diagnosis not present

## 2017-03-19 DIAGNOSIS — Z6839 Body mass index (BMI) 39.0-39.9, adult: Secondary | ICD-10-CM | POA: Diagnosis not present

## 2017-03-19 DIAGNOSIS — K449 Diaphragmatic hernia without obstruction or gangrene: Secondary | ICD-10-CM | POA: Diagnosis not present

## 2017-03-19 DIAGNOSIS — Y838 Other surgical procedures as the cause of abnormal reaction of the patient, or of later complication, without mention of misadventure at the time of the procedure: Secondary | ICD-10-CM | POA: Diagnosis not present

## 2017-03-19 DIAGNOSIS — R0902 Hypoxemia: Secondary | ICD-10-CM | POA: Diagnosis not present

## 2017-03-19 DIAGNOSIS — Z8679 Personal history of other diseases of the circulatory system: Secondary | ICD-10-CM | POA: Diagnosis not present

## 2017-03-19 DIAGNOSIS — E039 Hypothyroidism, unspecified: Secondary | ICD-10-CM | POA: Diagnosis not present

## 2017-03-19 DIAGNOSIS — I119 Hypertensive heart disease without heart failure: Secondary | ICD-10-CM | POA: Diagnosis not present

## 2017-03-19 DIAGNOSIS — T81718A Complication of other artery following a procedure, not elsewhere classified, initial encounter: Secondary | ICD-10-CM | POA: Diagnosis not present

## 2017-03-19 DIAGNOSIS — G4733 Obstructive sleep apnea (adult) (pediatric): Secondary | ICD-10-CM | POA: Diagnosis not present

## 2017-03-20 DIAGNOSIS — R0602 Shortness of breath: Secondary | ICD-10-CM | POA: Diagnosis not present

## 2017-03-20 DIAGNOSIS — J9811 Atelectasis: Secondary | ICD-10-CM | POA: Diagnosis not present

## 2017-03-27 DIAGNOSIS — G4733 Obstructive sleep apnea (adult) (pediatric): Secondary | ICD-10-CM | POA: Diagnosis not present

## 2017-04-02 DIAGNOSIS — Z6841 Body Mass Index (BMI) 40.0 and over, adult: Secondary | ICD-10-CM | POA: Diagnosis not present

## 2017-04-02 DIAGNOSIS — R7303 Prediabetes: Secondary | ICD-10-CM | POA: Diagnosis not present

## 2017-04-02 DIAGNOSIS — R0602 Shortness of breath: Secondary | ICD-10-CM | POA: Diagnosis not present

## 2017-04-02 DIAGNOSIS — G4733 Obstructive sleep apnea (adult) (pediatric): Secondary | ICD-10-CM | POA: Diagnosis not present

## 2017-04-02 DIAGNOSIS — N183 Chronic kidney disease, stage 3 (moderate): Secondary | ICD-10-CM | POA: Diagnosis not present

## 2017-04-02 DIAGNOSIS — I50810 Right heart failure, unspecified: Secondary | ICD-10-CM | POA: Diagnosis not present

## 2017-04-02 DIAGNOSIS — J810 Acute pulmonary edema: Secondary | ICD-10-CM | POA: Diagnosis not present

## 2017-04-02 DIAGNOSIS — I481 Persistent atrial fibrillation: Secondary | ICD-10-CM | POA: Diagnosis not present

## 2017-04-02 DIAGNOSIS — A419 Sepsis, unspecified organism: Secondary | ICD-10-CM | POA: Diagnosis not present

## 2017-04-02 DIAGNOSIS — I071 Rheumatic tricuspid insufficiency: Secondary | ICD-10-CM | POA: Diagnosis not present

## 2017-04-02 DIAGNOSIS — I9589 Other hypotension: Secondary | ICD-10-CM | POA: Diagnosis not present

## 2017-04-02 DIAGNOSIS — Z8249 Family history of ischemic heart disease and other diseases of the circulatory system: Secondary | ICD-10-CM | POA: Diagnosis not present

## 2017-04-02 DIAGNOSIS — Z8679 Personal history of other diseases of the circulatory system: Secondary | ICD-10-CM | POA: Diagnosis not present

## 2017-04-02 DIAGNOSIS — E669 Obesity, unspecified: Secondary | ICD-10-CM | POA: Diagnosis not present

## 2017-04-02 DIAGNOSIS — I447 Left bundle-branch block, unspecified: Secondary | ICD-10-CM | POA: Diagnosis not present

## 2017-04-02 DIAGNOSIS — J9601 Acute respiratory failure with hypoxia: Secondary | ICD-10-CM | POA: Diagnosis not present

## 2017-04-02 DIAGNOSIS — I119 Hypertensive heart disease without heart failure: Secondary | ICD-10-CM | POA: Diagnosis not present

## 2017-04-02 DIAGNOSIS — R739 Hyperglycemia, unspecified: Secondary | ICD-10-CM | POA: Diagnosis not present

## 2017-04-02 DIAGNOSIS — I361 Nonrheumatic tricuspid (valve) insufficiency: Secondary | ICD-10-CM | POA: Diagnosis not present

## 2017-04-02 DIAGNOSIS — E039 Hypothyroidism, unspecified: Secondary | ICD-10-CM | POA: Diagnosis not present

## 2017-04-02 DIAGNOSIS — Z86711 Personal history of pulmonary embolism: Secondary | ICD-10-CM | POA: Diagnosis not present

## 2017-04-02 DIAGNOSIS — J189 Pneumonia, unspecified organism: Secondary | ICD-10-CM | POA: Diagnosis not present

## 2017-04-02 DIAGNOSIS — J9602 Acute respiratory failure with hypercapnia: Secondary | ICD-10-CM | POA: Diagnosis not present

## 2017-04-02 DIAGNOSIS — R55 Syncope and collapse: Secondary | ICD-10-CM | POA: Diagnosis not present

## 2017-04-02 DIAGNOSIS — D72829 Elevated white blood cell count, unspecified: Secondary | ICD-10-CM | POA: Diagnosis not present

## 2017-04-02 DIAGNOSIS — E872 Acidosis: Secondary | ICD-10-CM | POA: Diagnosis not present

## 2017-04-02 DIAGNOSIS — N179 Acute kidney failure, unspecified: Secondary | ICD-10-CM | POA: Diagnosis not present

## 2017-04-02 DIAGNOSIS — I482 Chronic atrial fibrillation: Secondary | ICD-10-CM | POA: Diagnosis not present

## 2017-04-02 DIAGNOSIS — I130 Hypertensive heart and chronic kidney disease with heart failure and stage 1 through stage 4 chronic kidney disease, or unspecified chronic kidney disease: Secondary | ICD-10-CM | POA: Diagnosis not present

## 2017-04-02 DIAGNOSIS — R918 Other nonspecific abnormal finding of lung field: Secondary | ICD-10-CM | POA: Diagnosis not present

## 2017-04-02 DIAGNOSIS — I272 Pulmonary hypertension, unspecified: Secondary | ICD-10-CM | POA: Diagnosis not present

## 2017-04-02 DIAGNOSIS — I517 Cardiomegaly: Secondary | ICD-10-CM | POA: Diagnosis not present

## 2017-04-02 DIAGNOSIS — Z6839 Body mass index (BMI) 39.0-39.9, adult: Secondary | ICD-10-CM | POA: Diagnosis not present

## 2017-04-02 DIAGNOSIS — Z452 Encounter for adjustment and management of vascular access device: Secondary | ICD-10-CM | POA: Diagnosis not present

## 2017-04-02 DIAGNOSIS — N289 Disorder of kidney and ureter, unspecified: Secondary | ICD-10-CM | POA: Diagnosis not present

## 2017-04-02 DIAGNOSIS — R6521 Severe sepsis with septic shock: Secondary | ICD-10-CM | POA: Diagnosis not present

## 2017-04-02 DIAGNOSIS — I2699 Other pulmonary embolism without acute cor pulmonale: Secondary | ICD-10-CM | POA: Diagnosis not present

## 2017-04-03 DIAGNOSIS — I4891 Unspecified atrial fibrillation: Secondary | ICD-10-CM | POA: Diagnosis not present

## 2017-04-03 DIAGNOSIS — Z9911 Dependence on respirator [ventilator] status: Secondary | ICD-10-CM | POA: Diagnosis not present

## 2017-04-03 DIAGNOSIS — J189 Pneumonia, unspecified organism: Secondary | ICD-10-CM | POA: Diagnosis not present

## 2017-04-03 DIAGNOSIS — I272 Pulmonary hypertension, unspecified: Secondary | ICD-10-CM | POA: Diagnosis not present

## 2017-04-03 DIAGNOSIS — J9602 Acute respiratory failure with hypercapnia: Secondary | ICD-10-CM | POA: Diagnosis not present

## 2017-04-03 DIAGNOSIS — J9601 Acute respiratory failure with hypoxia: Secondary | ICD-10-CM | POA: Diagnosis not present

## 2017-04-03 DIAGNOSIS — N289 Disorder of kidney and ureter, unspecified: Secondary | ICD-10-CM | POA: Diagnosis not present

## 2017-04-03 DIAGNOSIS — N189 Chronic kidney disease, unspecified: Secondary | ICD-10-CM | POA: Diagnosis not present

## 2017-04-03 DIAGNOSIS — Z6841 Body Mass Index (BMI) 40.0 and over, adult: Secondary | ICD-10-CM | POA: Diagnosis not present

## 2017-04-03 DIAGNOSIS — I2699 Other pulmonary embolism without acute cor pulmonale: Secondary | ICD-10-CM | POA: Diagnosis not present

## 2017-04-03 DIAGNOSIS — A419 Sepsis, unspecified organism: Secondary | ICD-10-CM | POA: Diagnosis not present

## 2017-04-04 DIAGNOSIS — G4733 Obstructive sleep apnea (adult) (pediatric): Secondary | ICD-10-CM | POA: Diagnosis not present

## 2017-04-04 DIAGNOSIS — R000 Tachycardia, unspecified: Secondary | ICD-10-CM | POA: Diagnosis not present

## 2017-04-04 DIAGNOSIS — R579 Shock, unspecified: Secondary | ICD-10-CM | POA: Diagnosis not present

## 2017-04-04 DIAGNOSIS — D72829 Elevated white blood cell count, unspecified: Secondary | ICD-10-CM | POA: Diagnosis not present

## 2017-04-04 DIAGNOSIS — R0989 Other specified symptoms and signs involving the circulatory and respiratory systems: Secondary | ICD-10-CM | POA: Diagnosis not present

## 2017-04-04 DIAGNOSIS — E039 Hypothyroidism, unspecified: Secondary | ICD-10-CM | POA: Diagnosis not present

## 2017-04-04 DIAGNOSIS — J9602 Acute respiratory failure with hypercapnia: Secondary | ICD-10-CM | POA: Diagnosis not present

## 2017-04-04 DIAGNOSIS — A419 Sepsis, unspecified organism: Secondary | ICD-10-CM | POA: Diagnosis not present

## 2017-04-04 DIAGNOSIS — Z7901 Long term (current) use of anticoagulants: Secondary | ICD-10-CM | POA: Diagnosis not present

## 2017-04-04 DIAGNOSIS — J189 Pneumonia, unspecified organism: Secondary | ICD-10-CM | POA: Diagnosis not present

## 2017-04-04 DIAGNOSIS — R739 Hyperglycemia, unspecified: Secondary | ICD-10-CM | POA: Diagnosis not present

## 2017-04-04 DIAGNOSIS — I482 Chronic atrial fibrillation: Secondary | ICD-10-CM | POA: Diagnosis not present

## 2017-04-04 DIAGNOSIS — N179 Acute kidney failure, unspecified: Secondary | ICD-10-CM | POA: Diagnosis not present

## 2017-04-04 DIAGNOSIS — N289 Disorder of kidney and ureter, unspecified: Secondary | ICD-10-CM | POA: Diagnosis not present

## 2017-04-04 DIAGNOSIS — R918 Other nonspecific abnormal finding of lung field: Secondary | ICD-10-CM | POA: Diagnosis not present

## 2017-04-04 DIAGNOSIS — I481 Persistent atrial fibrillation: Secondary | ICD-10-CM | POA: Diagnosis not present

## 2017-04-04 DIAGNOSIS — Z86711 Personal history of pulmonary embolism: Secondary | ICD-10-CM | POA: Diagnosis not present

## 2017-04-04 DIAGNOSIS — Z6841 Body Mass Index (BMI) 40.0 and over, adult: Secondary | ICD-10-CM | POA: Diagnosis not present

## 2017-04-04 DIAGNOSIS — I9589 Other hypotension: Secondary | ICD-10-CM | POA: Diagnosis not present

## 2017-04-04 DIAGNOSIS — J9601 Acute respiratory failure with hypoxia: Secondary | ICD-10-CM | POA: Diagnosis not present

## 2017-04-05 DIAGNOSIS — I4891 Unspecified atrial fibrillation: Secondary | ICD-10-CM | POA: Diagnosis not present

## 2017-04-05 DIAGNOSIS — J189 Pneumonia, unspecified organism: Secondary | ICD-10-CM | POA: Diagnosis not present

## 2017-04-05 DIAGNOSIS — N289 Disorder of kidney and ureter, unspecified: Secondary | ICD-10-CM | POA: Diagnosis not present

## 2017-04-05 DIAGNOSIS — J9602 Acute respiratory failure with hypercapnia: Secondary | ICD-10-CM | POA: Diagnosis not present

## 2017-04-05 DIAGNOSIS — J9601 Acute respiratory failure with hypoxia: Secondary | ICD-10-CM | POA: Diagnosis not present

## 2017-04-05 DIAGNOSIS — I272 Pulmonary hypertension, unspecified: Secondary | ICD-10-CM | POA: Diagnosis not present

## 2017-04-05 DIAGNOSIS — Z6838 Body mass index (BMI) 38.0-38.9, adult: Secondary | ICD-10-CM | POA: Diagnosis not present

## 2017-04-05 DIAGNOSIS — Z86711 Personal history of pulmonary embolism: Secondary | ICD-10-CM | POA: Diagnosis not present

## 2017-04-05 DIAGNOSIS — N189 Chronic kidney disease, unspecified: Secondary | ICD-10-CM | POA: Diagnosis not present

## 2017-04-06 DIAGNOSIS — J9601 Acute respiratory failure with hypoxia: Secondary | ICD-10-CM | POA: Diagnosis not present

## 2017-04-06 DIAGNOSIS — Z86711 Personal history of pulmonary embolism: Secondary | ICD-10-CM | POA: Diagnosis not present

## 2017-04-06 DIAGNOSIS — R0902 Hypoxemia: Secondary | ICD-10-CM | POA: Diagnosis not present

## 2017-04-06 DIAGNOSIS — N189 Chronic kidney disease, unspecified: Secondary | ICD-10-CM | POA: Diagnosis not present

## 2017-04-06 DIAGNOSIS — J9602 Acute respiratory failure with hypercapnia: Secondary | ICD-10-CM | POA: Diagnosis not present

## 2017-04-06 DIAGNOSIS — I4891 Unspecified atrial fibrillation: Secondary | ICD-10-CM | POA: Diagnosis not present

## 2017-04-06 DIAGNOSIS — J189 Pneumonia, unspecified organism: Secondary | ICD-10-CM | POA: Diagnosis not present

## 2017-04-06 DIAGNOSIS — Z6838 Body mass index (BMI) 38.0-38.9, adult: Secondary | ICD-10-CM | POA: Diagnosis not present

## 2017-04-07 DIAGNOSIS — N289 Disorder of kidney and ureter, unspecified: Secondary | ICD-10-CM | POA: Diagnosis not present

## 2017-04-07 DIAGNOSIS — I4891 Unspecified atrial fibrillation: Secondary | ICD-10-CM | POA: Diagnosis not present

## 2017-04-07 DIAGNOSIS — I959 Hypotension, unspecified: Secondary | ICD-10-CM | POA: Diagnosis not present

## 2017-04-07 DIAGNOSIS — J9602 Acute respiratory failure with hypercapnia: Secondary | ICD-10-CM | POA: Diagnosis not present

## 2017-04-07 DIAGNOSIS — N189 Chronic kidney disease, unspecified: Secondary | ICD-10-CM | POA: Diagnosis not present

## 2017-04-07 DIAGNOSIS — I272 Pulmonary hypertension, unspecified: Secondary | ICD-10-CM | POA: Diagnosis not present

## 2017-04-07 DIAGNOSIS — J189 Pneumonia, unspecified organism: Secondary | ICD-10-CM | POA: Diagnosis not present

## 2017-04-07 DIAGNOSIS — J9601 Acute respiratory failure with hypoxia: Secondary | ICD-10-CM | POA: Diagnosis not present

## 2017-04-08 DIAGNOSIS — Z6838 Body mass index (BMI) 38.0-38.9, adult: Secondary | ICD-10-CM | POA: Diagnosis not present

## 2017-04-08 DIAGNOSIS — I4891 Unspecified atrial fibrillation: Secondary | ICD-10-CM | POA: Diagnosis not present

## 2017-04-08 DIAGNOSIS — J9602 Acute respiratory failure with hypercapnia: Secondary | ICD-10-CM | POA: Diagnosis not present

## 2017-04-08 DIAGNOSIS — N189 Chronic kidney disease, unspecified: Secondary | ICD-10-CM | POA: Diagnosis not present

## 2017-04-08 DIAGNOSIS — I272 Pulmonary hypertension, unspecified: Secondary | ICD-10-CM | POA: Diagnosis not present

## 2017-04-08 DIAGNOSIS — N289 Disorder of kidney and ureter, unspecified: Secondary | ICD-10-CM | POA: Diagnosis not present

## 2017-04-08 DIAGNOSIS — J189 Pneumonia, unspecified organism: Secondary | ICD-10-CM | POA: Diagnosis not present

## 2017-04-08 DIAGNOSIS — J9601 Acute respiratory failure with hypoxia: Secondary | ICD-10-CM | POA: Diagnosis not present

## 2017-04-09 DIAGNOSIS — Z6838 Body mass index (BMI) 38.0-38.9, adult: Secondary | ICD-10-CM | POA: Diagnosis not present

## 2017-04-09 DIAGNOSIS — J9601 Acute respiratory failure with hypoxia: Secondary | ICD-10-CM | POA: Diagnosis not present

## 2017-04-09 DIAGNOSIS — Z86711 Personal history of pulmonary embolism: Secondary | ICD-10-CM | POA: Diagnosis not present

## 2017-04-09 DIAGNOSIS — I481 Persistent atrial fibrillation: Secondary | ICD-10-CM | POA: Diagnosis not present

## 2017-04-09 DIAGNOSIS — J9602 Acute respiratory failure with hypercapnia: Secondary | ICD-10-CM | POA: Diagnosis not present

## 2017-04-09 DIAGNOSIS — I119 Hypertensive heart disease without heart failure: Secondary | ICD-10-CM | POA: Diagnosis not present

## 2017-04-09 DIAGNOSIS — I2782 Chronic pulmonary embolism: Secondary | ICD-10-CM | POA: Diagnosis not present

## 2017-04-09 DIAGNOSIS — Z7901 Long term (current) use of anticoagulants: Secondary | ICD-10-CM | POA: Diagnosis not present

## 2017-04-09 DIAGNOSIS — I4891 Unspecified atrial fibrillation: Secondary | ICD-10-CM | POA: Diagnosis not present

## 2017-04-10 DIAGNOSIS — I4891 Unspecified atrial fibrillation: Secondary | ICD-10-CM | POA: Diagnosis not present

## 2017-04-10 DIAGNOSIS — Z86711 Personal history of pulmonary embolism: Secondary | ICD-10-CM | POA: Diagnosis not present

## 2017-04-10 DIAGNOSIS — R918 Other nonspecific abnormal finding of lung field: Secondary | ICD-10-CM | POA: Diagnosis not present

## 2017-04-10 DIAGNOSIS — N189 Chronic kidney disease, unspecified: Secondary | ICD-10-CM | POA: Diagnosis not present

## 2017-04-10 DIAGNOSIS — R5381 Other malaise: Secondary | ICD-10-CM | POA: Diagnosis not present

## 2017-04-10 DIAGNOSIS — I482 Chronic atrial fibrillation: Secondary | ICD-10-CM | POA: Diagnosis not present

## 2017-04-10 DIAGNOSIS — Z6838 Body mass index (BMI) 38.0-38.9, adult: Secondary | ICD-10-CM | POA: Diagnosis not present

## 2017-04-10 DIAGNOSIS — Z7901 Long term (current) use of anticoagulants: Secondary | ICD-10-CM | POA: Diagnosis not present

## 2017-04-10 DIAGNOSIS — E039 Hypothyroidism, unspecified: Secondary | ICD-10-CM | POA: Diagnosis not present

## 2017-04-10 DIAGNOSIS — G4733 Obstructive sleep apnea (adult) (pediatric): Secondary | ICD-10-CM | POA: Diagnosis not present

## 2017-04-10 DIAGNOSIS — J9602 Acute respiratory failure with hypercapnia: Secondary | ICD-10-CM | POA: Diagnosis not present

## 2017-04-10 DIAGNOSIS — J9601 Acute respiratory failure with hypoxia: Secondary | ICD-10-CM | POA: Diagnosis not present

## 2017-04-10 DIAGNOSIS — J9811 Atelectasis: Secondary | ICD-10-CM | POA: Diagnosis not present

## 2017-04-11 DIAGNOSIS — I9589 Other hypotension: Secondary | ICD-10-CM | POA: Diagnosis not present

## 2017-04-11 DIAGNOSIS — J9611 Chronic respiratory failure with hypoxia: Secondary | ICD-10-CM | POA: Diagnosis not present

## 2017-04-11 DIAGNOSIS — N289 Disorder of kidney and ureter, unspecified: Secondary | ICD-10-CM | POA: Diagnosis not present

## 2017-04-11 DIAGNOSIS — I272 Pulmonary hypertension, unspecified: Secondary | ICD-10-CM | POA: Diagnosis not present

## 2017-04-11 DIAGNOSIS — Z86711 Personal history of pulmonary embolism: Secondary | ICD-10-CM | POA: Diagnosis not present

## 2017-04-11 DIAGNOSIS — I482 Chronic atrial fibrillation: Secondary | ICD-10-CM | POA: Diagnosis not present

## 2017-04-11 DIAGNOSIS — I4891 Unspecified atrial fibrillation: Secondary | ICD-10-CM | POA: Diagnosis not present

## 2017-04-11 DIAGNOSIS — R918 Other nonspecific abnormal finding of lung field: Secondary | ICD-10-CM | POA: Diagnosis not present

## 2017-04-11 DIAGNOSIS — E039 Hypothyroidism, unspecified: Secondary | ICD-10-CM | POA: Diagnosis not present

## 2017-04-11 DIAGNOSIS — R5381 Other malaise: Secondary | ICD-10-CM | POA: Diagnosis not present

## 2017-04-11 DIAGNOSIS — N189 Chronic kidney disease, unspecified: Secondary | ICD-10-CM | POA: Diagnosis not present

## 2017-04-11 DIAGNOSIS — Z6838 Body mass index (BMI) 38.0-38.9, adult: Secondary | ICD-10-CM | POA: Diagnosis not present

## 2017-04-11 DIAGNOSIS — G4733 Obstructive sleep apnea (adult) (pediatric): Secondary | ICD-10-CM | POA: Diagnosis not present

## 2017-04-11 DIAGNOSIS — J9601 Acute respiratory failure with hypoxia: Secondary | ICD-10-CM | POA: Diagnosis not present

## 2017-04-11 DIAGNOSIS — J9602 Acute respiratory failure with hypercapnia: Secondary | ICD-10-CM | POA: Diagnosis not present

## 2017-04-11 DIAGNOSIS — J189 Pneumonia, unspecified organism: Secondary | ICD-10-CM | POA: Diagnosis not present

## 2017-04-16 DIAGNOSIS — Z8709 Personal history of other diseases of the respiratory system: Secondary | ICD-10-CM | POA: Diagnosis not present

## 2017-04-16 DIAGNOSIS — R5381 Other malaise: Secondary | ICD-10-CM | POA: Diagnosis not present

## 2017-04-16 DIAGNOSIS — Z8619 Personal history of other infectious and parasitic diseases: Secondary | ICD-10-CM | POA: Diagnosis not present

## 2017-04-16 DIAGNOSIS — I482 Chronic atrial fibrillation: Secondary | ICD-10-CM | POA: Diagnosis not present

## 2017-04-16 DIAGNOSIS — N189 Chronic kidney disease, unspecified: Secondary | ICD-10-CM | POA: Diagnosis not present

## 2017-04-16 DIAGNOSIS — Z8679 Personal history of other diseases of the circulatory system: Secondary | ICD-10-CM | POA: Diagnosis not present

## 2017-04-17 DIAGNOSIS — I482 Chronic atrial fibrillation: Secondary | ICD-10-CM | POA: Diagnosis not present

## 2017-04-17 DIAGNOSIS — G4733 Obstructive sleep apnea (adult) (pediatric): Secondary | ICD-10-CM | POA: Diagnosis not present

## 2017-04-17 DIAGNOSIS — I272 Pulmonary hypertension, unspecified: Secondary | ICD-10-CM | POA: Diagnosis not present

## 2017-04-17 DIAGNOSIS — J9602 Acute respiratory failure with hypercapnia: Secondary | ICD-10-CM | POA: Diagnosis not present

## 2017-04-17 DIAGNOSIS — R918 Other nonspecific abnormal finding of lung field: Secondary | ICD-10-CM | POA: Diagnosis not present

## 2017-04-17 DIAGNOSIS — J9601 Acute respiratory failure with hypoxia: Secondary | ICD-10-CM | POA: Diagnosis not present

## 2017-04-17 DIAGNOSIS — Z86711 Personal history of pulmonary embolism: Secondary | ICD-10-CM | POA: Diagnosis not present

## 2017-04-17 DIAGNOSIS — E039 Hypothyroidism, unspecified: Secondary | ICD-10-CM | POA: Diagnosis not present

## 2017-04-17 DIAGNOSIS — N183 Chronic kidney disease, stage 3 (moderate): Secondary | ICD-10-CM | POA: Diagnosis not present

## 2017-04-17 DIAGNOSIS — Z8701 Personal history of pneumonia (recurrent): Secondary | ICD-10-CM | POA: Diagnosis not present

## 2017-04-17 DIAGNOSIS — I129 Hypertensive chronic kidney disease with stage 1 through stage 4 chronic kidney disease, or unspecified chronic kidney disease: Secondary | ICD-10-CM | POA: Diagnosis not present

## 2017-04-18 DIAGNOSIS — E039 Hypothyroidism, unspecified: Secondary | ICD-10-CM | POA: Diagnosis not present

## 2017-04-18 DIAGNOSIS — R7989 Other specified abnormal findings of blood chemistry: Secondary | ICD-10-CM | POA: Diagnosis not present

## 2017-04-24 DIAGNOSIS — G4733 Obstructive sleep apnea (adult) (pediatric): Secondary | ICD-10-CM | POA: Diagnosis not present

## 2017-04-24 DIAGNOSIS — J9611 Chronic respiratory failure with hypoxia: Secondary | ICD-10-CM | POA: Diagnosis not present

## 2017-04-24 DIAGNOSIS — R5381 Other malaise: Secondary | ICD-10-CM | POA: Diagnosis not present

## 2017-04-27 DIAGNOSIS — G4730 Sleep apnea, unspecified: Secondary | ICD-10-CM | POA: Diagnosis not present

## 2017-04-27 DIAGNOSIS — I482 Chronic atrial fibrillation: Secondary | ICD-10-CM | POA: Diagnosis not present

## 2017-04-27 DIAGNOSIS — G4733 Obstructive sleep apnea (adult) (pediatric): Secondary | ICD-10-CM | POA: Diagnosis not present

## 2017-04-27 DIAGNOSIS — Z86711 Personal history of pulmonary embolism: Secondary | ICD-10-CM | POA: Diagnosis not present

## 2017-04-29 DIAGNOSIS — I272 Pulmonary hypertension, unspecified: Secondary | ICD-10-CM | POA: Diagnosis not present

## 2017-04-29 DIAGNOSIS — I482 Chronic atrial fibrillation: Secondary | ICD-10-CM | POA: Diagnosis not present

## 2017-04-29 DIAGNOSIS — R918 Other nonspecific abnormal finding of lung field: Secondary | ICD-10-CM | POA: Diagnosis not present

## 2017-04-29 DIAGNOSIS — Z86711 Personal history of pulmonary embolism: Secondary | ICD-10-CM | POA: Diagnosis not present

## 2017-04-29 DIAGNOSIS — I129 Hypertensive chronic kidney disease with stage 1 through stage 4 chronic kidney disease, or unspecified chronic kidney disease: Secondary | ICD-10-CM | POA: Diagnosis not present

## 2017-04-29 DIAGNOSIS — E039 Hypothyroidism, unspecified: Secondary | ICD-10-CM | POA: Diagnosis not present

## 2017-04-29 DIAGNOSIS — J9601 Acute respiratory failure with hypoxia: Secondary | ICD-10-CM | POA: Diagnosis not present

## 2017-04-29 DIAGNOSIS — N183 Chronic kidney disease, stage 3 (moderate): Secondary | ICD-10-CM | POA: Diagnosis not present

## 2017-04-29 DIAGNOSIS — J9602 Acute respiratory failure with hypercapnia: Secondary | ICD-10-CM | POA: Diagnosis not present

## 2017-04-29 DIAGNOSIS — G4733 Obstructive sleep apnea (adult) (pediatric): Secondary | ICD-10-CM | POA: Diagnosis not present

## 2017-04-29 DIAGNOSIS — Z8701 Personal history of pneumonia (recurrent): Secondary | ICD-10-CM | POA: Diagnosis not present

## 2017-05-01 DIAGNOSIS — J9601 Acute respiratory failure with hypoxia: Secondary | ICD-10-CM | POA: Diagnosis not present

## 2017-05-01 DIAGNOSIS — J9602 Acute respiratory failure with hypercapnia: Secondary | ICD-10-CM | POA: Diagnosis not present

## 2017-05-01 DIAGNOSIS — I9589 Other hypotension: Secondary | ICD-10-CM | POA: Diagnosis not present

## 2017-05-01 DIAGNOSIS — I482 Chronic atrial fibrillation: Secondary | ICD-10-CM | POA: Diagnosis not present

## 2017-05-01 DIAGNOSIS — Z86711 Personal history of pulmonary embolism: Secondary | ICD-10-CM | POA: Diagnosis not present

## 2017-05-01 DIAGNOSIS — E039 Hypothyroidism, unspecified: Secondary | ICD-10-CM | POA: Diagnosis not present

## 2017-05-01 DIAGNOSIS — Z79899 Other long term (current) drug therapy: Secondary | ICD-10-CM | POA: Diagnosis not present

## 2017-05-01 DIAGNOSIS — Z6839 Body mass index (BMI) 39.0-39.9, adult: Secondary | ICD-10-CM | POA: Diagnosis not present

## 2017-05-01 DIAGNOSIS — I10 Essential (primary) hypertension: Secondary | ICD-10-CM | POA: Diagnosis not present

## 2017-05-01 DIAGNOSIS — G4733 Obstructive sleep apnea (adult) (pediatric): Secondary | ICD-10-CM | POA: Diagnosis not present

## 2017-05-02 DIAGNOSIS — Z8701 Personal history of pneumonia (recurrent): Secondary | ICD-10-CM | POA: Diagnosis not present

## 2017-05-02 DIAGNOSIS — J9602 Acute respiratory failure with hypercapnia: Secondary | ICD-10-CM | POA: Diagnosis not present

## 2017-05-02 DIAGNOSIS — R918 Other nonspecific abnormal finding of lung field: Secondary | ICD-10-CM | POA: Diagnosis not present

## 2017-05-02 DIAGNOSIS — I129 Hypertensive chronic kidney disease with stage 1 through stage 4 chronic kidney disease, or unspecified chronic kidney disease: Secondary | ICD-10-CM | POA: Diagnosis not present

## 2017-05-02 DIAGNOSIS — Z86711 Personal history of pulmonary embolism: Secondary | ICD-10-CM | POA: Diagnosis not present

## 2017-05-02 DIAGNOSIS — I272 Pulmonary hypertension, unspecified: Secondary | ICD-10-CM | POA: Diagnosis not present

## 2017-05-02 DIAGNOSIS — E039 Hypothyroidism, unspecified: Secondary | ICD-10-CM | POA: Diagnosis not present

## 2017-05-02 DIAGNOSIS — G4733 Obstructive sleep apnea (adult) (pediatric): Secondary | ICD-10-CM | POA: Diagnosis not present

## 2017-05-02 DIAGNOSIS — I482 Chronic atrial fibrillation: Secondary | ICD-10-CM | POA: Diagnosis not present

## 2017-05-02 DIAGNOSIS — J9601 Acute respiratory failure with hypoxia: Secondary | ICD-10-CM | POA: Diagnosis not present

## 2017-05-02 DIAGNOSIS — N183 Chronic kidney disease, stage 3 (moderate): Secondary | ICD-10-CM | POA: Diagnosis not present

## 2017-05-07 DIAGNOSIS — I482 Chronic atrial fibrillation: Secondary | ICD-10-CM | POA: Diagnosis not present

## 2017-05-07 DIAGNOSIS — I10 Essential (primary) hypertension: Secondary | ICD-10-CM | POA: Diagnosis not present

## 2017-05-08 DIAGNOSIS — G4733 Obstructive sleep apnea (adult) (pediatric): Secondary | ICD-10-CM | POA: Diagnosis not present

## 2017-05-08 DIAGNOSIS — N183 Chronic kidney disease, stage 3 (moderate): Secondary | ICD-10-CM | POA: Diagnosis not present

## 2017-05-08 DIAGNOSIS — J9601 Acute respiratory failure with hypoxia: Secondary | ICD-10-CM | POA: Diagnosis not present

## 2017-05-08 DIAGNOSIS — Z8701 Personal history of pneumonia (recurrent): Secondary | ICD-10-CM | POA: Diagnosis not present

## 2017-05-08 DIAGNOSIS — J9602 Acute respiratory failure with hypercapnia: Secondary | ICD-10-CM | POA: Diagnosis not present

## 2017-05-08 DIAGNOSIS — R918 Other nonspecific abnormal finding of lung field: Secondary | ICD-10-CM | POA: Diagnosis not present

## 2017-05-08 DIAGNOSIS — I129 Hypertensive chronic kidney disease with stage 1 through stage 4 chronic kidney disease, or unspecified chronic kidney disease: Secondary | ICD-10-CM | POA: Diagnosis not present

## 2017-05-08 DIAGNOSIS — I272 Pulmonary hypertension, unspecified: Secondary | ICD-10-CM | POA: Diagnosis not present

## 2017-05-08 DIAGNOSIS — I482 Chronic atrial fibrillation: Secondary | ICD-10-CM | POA: Diagnosis not present

## 2017-05-08 DIAGNOSIS — E039 Hypothyroidism, unspecified: Secondary | ICD-10-CM | POA: Diagnosis not present

## 2017-05-08 DIAGNOSIS — Z86711 Personal history of pulmonary embolism: Secondary | ICD-10-CM | POA: Diagnosis not present

## 2017-05-10 DIAGNOSIS — I272 Pulmonary hypertension, unspecified: Secondary | ICD-10-CM | POA: Diagnosis not present

## 2017-05-10 DIAGNOSIS — I482 Chronic atrial fibrillation: Secondary | ICD-10-CM | POA: Diagnosis not present

## 2017-05-10 DIAGNOSIS — J9601 Acute respiratory failure with hypoxia: Secondary | ICD-10-CM | POA: Diagnosis not present

## 2017-05-10 DIAGNOSIS — I129 Hypertensive chronic kidney disease with stage 1 through stage 4 chronic kidney disease, or unspecified chronic kidney disease: Secondary | ICD-10-CM | POA: Diagnosis not present

## 2017-05-11 DIAGNOSIS — I340 Nonrheumatic mitral (valve) insufficiency: Secondary | ICD-10-CM | POA: Diagnosis not present

## 2017-05-11 DIAGNOSIS — N189 Chronic kidney disease, unspecified: Secondary | ICD-10-CM | POA: Diagnosis not present

## 2017-05-11 DIAGNOSIS — I1310 Hypertensive heart and chronic kidney disease without heart failure, with stage 1 through stage 4 chronic kidney disease, or unspecified chronic kidney disease: Secondary | ICD-10-CM | POA: Diagnosis not present

## 2017-05-11 DIAGNOSIS — Z452 Encounter for adjustment and management of vascular access device: Secondary | ICD-10-CM | POA: Diagnosis not present

## 2017-05-11 DIAGNOSIS — J90 Pleural effusion, not elsewhere classified: Secondary | ICD-10-CM | POA: Diagnosis not present

## 2017-05-11 DIAGNOSIS — Z86718 Personal history of other venous thrombosis and embolism: Secondary | ICD-10-CM | POA: Diagnosis not present

## 2017-05-11 DIAGNOSIS — I10 Essential (primary) hypertension: Secondary | ICD-10-CM | POA: Diagnosis not present

## 2017-05-11 DIAGNOSIS — J9690 Respiratory failure, unspecified, unspecified whether with hypoxia or hypercapnia: Secondary | ICD-10-CM | POA: Diagnosis not present

## 2017-05-11 DIAGNOSIS — J9601 Acute respiratory failure with hypoxia: Secondary | ICD-10-CM | POA: Diagnosis not present

## 2017-05-11 DIAGNOSIS — I481 Persistent atrial fibrillation: Secondary | ICD-10-CM | POA: Diagnosis not present

## 2017-05-11 DIAGNOSIS — R7881 Bacteremia: Secondary | ICD-10-CM | POA: Diagnosis not present

## 2017-05-11 DIAGNOSIS — A4189 Other specified sepsis: Secondary | ICD-10-CM | POA: Diagnosis not present

## 2017-05-11 DIAGNOSIS — D649 Anemia, unspecified: Secondary | ICD-10-CM | POA: Diagnosis not present

## 2017-05-11 DIAGNOSIS — J181 Lobar pneumonia, unspecified organism: Secondary | ICD-10-CM | POA: Diagnosis not present

## 2017-05-11 DIAGNOSIS — I272 Pulmonary hypertension, unspecified: Secondary | ICD-10-CM | POA: Diagnosis not present

## 2017-05-11 DIAGNOSIS — J9602 Acute respiratory failure with hypercapnia: Secondary | ICD-10-CM | POA: Diagnosis not present

## 2017-05-11 DIAGNOSIS — Z7901 Long term (current) use of anticoagulants: Secondary | ICD-10-CM | POA: Diagnosis not present

## 2017-05-11 DIAGNOSIS — N179 Acute kidney failure, unspecified: Secondary | ICD-10-CM | POA: Diagnosis not present

## 2017-05-11 DIAGNOSIS — I5023 Acute on chronic systolic (congestive) heart failure: Secondary | ICD-10-CM | POA: Diagnosis not present

## 2017-05-11 DIAGNOSIS — R6520 Severe sepsis without septic shock: Secondary | ICD-10-CM | POA: Diagnosis not present

## 2017-05-11 DIAGNOSIS — E039 Hypothyroidism, unspecified: Secondary | ICD-10-CM | POA: Diagnosis not present

## 2017-05-11 DIAGNOSIS — A4150 Gram-negative sepsis, unspecified: Secondary | ICD-10-CM | POA: Diagnosis not present

## 2017-05-11 DIAGNOSIS — B9689 Other specified bacterial agents as the cause of diseases classified elsewhere: Secondary | ICD-10-CM | POA: Diagnosis not present

## 2017-05-11 DIAGNOSIS — I2699 Other pulmonary embolism without acute cor pulmonale: Secondary | ICD-10-CM | POA: Diagnosis not present

## 2017-05-11 DIAGNOSIS — Z9911 Dependence on respirator [ventilator] status: Secondary | ICD-10-CM | POA: Diagnosis not present

## 2017-05-11 DIAGNOSIS — I509 Heart failure, unspecified: Secondary | ICD-10-CM | POA: Diagnosis not present

## 2017-05-11 DIAGNOSIS — F331 Major depressive disorder, recurrent, moderate: Secondary | ICD-10-CM | POA: Diagnosis not present

## 2017-05-11 DIAGNOSIS — R05 Cough: Secondary | ICD-10-CM | POA: Diagnosis not present

## 2017-05-11 DIAGNOSIS — A4159 Other Gram-negative sepsis: Secondary | ICD-10-CM | POA: Diagnosis not present

## 2017-05-11 DIAGNOSIS — Z6838 Body mass index (BMI) 38.0-38.9, adult: Secondary | ICD-10-CM | POA: Diagnosis not present

## 2017-05-11 DIAGNOSIS — G9340 Encephalopathy, unspecified: Secondary | ICD-10-CM | POA: Diagnosis not present

## 2017-05-11 DIAGNOSIS — I110 Hypertensive heart disease with heart failure: Secondary | ICD-10-CM | POA: Diagnosis not present

## 2017-05-11 DIAGNOSIS — R0902 Hypoxemia: Secondary | ICD-10-CM | POA: Diagnosis not present

## 2017-05-11 DIAGNOSIS — I482 Chronic atrial fibrillation: Secondary | ICD-10-CM | POA: Diagnosis not present

## 2017-05-11 DIAGNOSIS — R451 Restlessness and agitation: Secondary | ICD-10-CM | POA: Diagnosis not present

## 2017-05-11 DIAGNOSIS — K5730 Diverticulosis of large intestine without perforation or abscess without bleeding: Secondary | ICD-10-CM | POA: Diagnosis not present

## 2017-05-11 DIAGNOSIS — I447 Left bundle-branch block, unspecified: Secondary | ICD-10-CM | POA: Diagnosis not present

## 2017-05-11 DIAGNOSIS — N183 Chronic kidney disease, stage 3 (moderate): Secondary | ICD-10-CM | POA: Diagnosis not present

## 2017-05-11 DIAGNOSIS — I2723 Pulmonary hypertension due to lung diseases and hypoxia: Secondary | ICD-10-CM | POA: Diagnosis not present

## 2017-05-11 DIAGNOSIS — R0682 Tachypnea, not elsewhere classified: Secondary | ICD-10-CM | POA: Diagnosis not present

## 2017-05-11 DIAGNOSIS — E876 Hypokalemia: Secondary | ICD-10-CM | POA: Diagnosis not present

## 2017-05-11 DIAGNOSIS — N289 Disorder of kidney and ureter, unspecified: Secondary | ICD-10-CM | POA: Diagnosis not present

## 2017-05-11 DIAGNOSIS — T17800A Unspecified foreign body in other parts of respiratory tract causing asphyxiation, initial encounter: Secondary | ICD-10-CM | POA: Diagnosis not present

## 2017-05-11 DIAGNOSIS — I517 Cardiomegaly: Secondary | ICD-10-CM | POA: Diagnosis not present

## 2017-05-11 DIAGNOSIS — E119 Type 2 diabetes mellitus without complications: Secondary | ICD-10-CM | POA: Diagnosis not present

## 2017-05-11 DIAGNOSIS — J9600 Acute respiratory failure, unspecified whether with hypoxia or hypercapnia: Secondary | ICD-10-CM | POA: Diagnosis not present

## 2017-05-11 DIAGNOSIS — Z8679 Personal history of other diseases of the circulatory system: Secondary | ICD-10-CM | POA: Diagnosis not present

## 2017-05-11 DIAGNOSIS — Z6841 Body Mass Index (BMI) 40.0 and over, adult: Secondary | ICD-10-CM | POA: Diagnosis not present

## 2017-05-11 DIAGNOSIS — F339 Major depressive disorder, recurrent, unspecified: Secondary | ICD-10-CM | POA: Diagnosis not present

## 2017-05-11 DIAGNOSIS — R918 Other nonspecific abnormal finding of lung field: Secondary | ICD-10-CM | POA: Diagnosis not present

## 2017-05-11 DIAGNOSIS — E872 Acidosis: Secondary | ICD-10-CM | POA: Diagnosis not present

## 2017-05-11 DIAGNOSIS — A419 Sepsis, unspecified organism: Secondary | ICD-10-CM | POA: Diagnosis not present

## 2017-05-11 DIAGNOSIS — Z86711 Personal history of pulmonary embolism: Secondary | ICD-10-CM | POA: Diagnosis not present

## 2017-05-11 DIAGNOSIS — E1122 Type 2 diabetes mellitus with diabetic chronic kidney disease: Secondary | ICD-10-CM | POA: Diagnosis not present

## 2017-05-11 DIAGNOSIS — D698 Other specified hemorrhagic conditions: Secondary | ICD-10-CM | POA: Diagnosis not present

## 2017-05-11 DIAGNOSIS — I130 Hypertensive heart and chronic kidney disease with heart failure and stage 1 through stage 4 chronic kidney disease, or unspecified chronic kidney disease: Secondary | ICD-10-CM | POA: Diagnosis not present

## 2017-05-11 DIAGNOSIS — J189 Pneumonia, unspecified organism: Secondary | ICD-10-CM | POA: Diagnosis not present

## 2017-05-11 DIAGNOSIS — R0602 Shortness of breath: Secondary | ICD-10-CM | POA: Diagnosis not present

## 2017-05-11 DIAGNOSIS — G4733 Obstructive sleep apnea (adult) (pediatric): Secondary | ICD-10-CM | POA: Diagnosis not present

## 2017-05-19 DIAGNOSIS — J189 Pneumonia, unspecified organism: Secondary | ICD-10-CM | POA: Diagnosis not present

## 2017-05-19 DIAGNOSIS — E039 Hypothyroidism, unspecified: Secondary | ICD-10-CM | POA: Diagnosis not present

## 2017-05-19 DIAGNOSIS — A419 Sepsis, unspecified organism: Secondary | ICD-10-CM | POA: Diagnosis not present

## 2017-05-19 DIAGNOSIS — Z452 Encounter for adjustment and management of vascular access device: Secondary | ICD-10-CM | POA: Diagnosis not present

## 2017-05-19 DIAGNOSIS — G4733 Obstructive sleep apnea (adult) (pediatric): Secondary | ICD-10-CM | POA: Diagnosis not present

## 2017-05-19 DIAGNOSIS — I272 Pulmonary hypertension, unspecified: Secondary | ICD-10-CM | POA: Diagnosis not present

## 2017-05-19 DIAGNOSIS — R918 Other nonspecific abnormal finding of lung field: Secondary | ICD-10-CM | POA: Diagnosis not present

## 2017-05-20 DIAGNOSIS — A419 Sepsis, unspecified organism: Secondary | ICD-10-CM | POA: Diagnosis not present

## 2017-05-20 DIAGNOSIS — J189 Pneumonia, unspecified organism: Secondary | ICD-10-CM | POA: Diagnosis not present

## 2017-05-20 DIAGNOSIS — I129 Hypertensive chronic kidney disease with stage 1 through stage 4 chronic kidney disease, or unspecified chronic kidney disease: Secondary | ICD-10-CM | POA: Diagnosis not present

## 2017-05-20 DIAGNOSIS — R918 Other nonspecific abnormal finding of lung field: Secondary | ICD-10-CM | POA: Diagnosis not present

## 2017-05-20 DIAGNOSIS — G4733 Obstructive sleep apnea (adult) (pediatric): Secondary | ICD-10-CM | POA: Diagnosis not present

## 2017-05-20 DIAGNOSIS — Z8701 Personal history of pneumonia (recurrent): Secondary | ICD-10-CM | POA: Diagnosis not present

## 2017-05-20 DIAGNOSIS — E039 Hypothyroidism, unspecified: Secondary | ICD-10-CM | POA: Diagnosis not present

## 2017-05-20 DIAGNOSIS — Z452 Encounter for adjustment and management of vascular access device: Secondary | ICD-10-CM | POA: Diagnosis not present

## 2017-05-20 DIAGNOSIS — Z86711 Personal history of pulmonary embolism: Secondary | ICD-10-CM | POA: Diagnosis not present

## 2017-05-20 DIAGNOSIS — I482 Chronic atrial fibrillation: Secondary | ICD-10-CM | POA: Diagnosis not present

## 2017-05-20 DIAGNOSIS — I272 Pulmonary hypertension, unspecified: Secondary | ICD-10-CM | POA: Diagnosis not present

## 2017-05-21 DIAGNOSIS — I482 Chronic atrial fibrillation: Secondary | ICD-10-CM | POA: Diagnosis not present

## 2017-05-21 DIAGNOSIS — I129 Hypertensive chronic kidney disease with stage 1 through stage 4 chronic kidney disease, or unspecified chronic kidney disease: Secondary | ICD-10-CM | POA: Diagnosis not present

## 2017-05-21 DIAGNOSIS — E039 Hypothyroidism, unspecified: Secondary | ICD-10-CM | POA: Diagnosis not present

## 2017-05-21 DIAGNOSIS — Z86711 Personal history of pulmonary embolism: Secondary | ICD-10-CM | POA: Diagnosis not present

## 2017-05-21 DIAGNOSIS — Z8701 Personal history of pneumonia (recurrent): Secondary | ICD-10-CM | POA: Diagnosis not present

## 2017-05-21 DIAGNOSIS — I272 Pulmonary hypertension, unspecified: Secondary | ICD-10-CM | POA: Diagnosis not present

## 2017-05-21 DIAGNOSIS — J9601 Acute respiratory failure with hypoxia: Secondary | ICD-10-CM | POA: Diagnosis not present

## 2017-05-21 DIAGNOSIS — N183 Chronic kidney disease, stage 3 (moderate): Secondary | ICD-10-CM | POA: Diagnosis not present

## 2017-05-21 DIAGNOSIS — G4733 Obstructive sleep apnea (adult) (pediatric): Secondary | ICD-10-CM | POA: Diagnosis not present

## 2017-05-21 DIAGNOSIS — J9602 Acute respiratory failure with hypercapnia: Secondary | ICD-10-CM | POA: Diagnosis not present

## 2017-05-21 DIAGNOSIS — R918 Other nonspecific abnormal finding of lung field: Secondary | ICD-10-CM | POA: Diagnosis not present

## 2017-05-22 DIAGNOSIS — Z8701 Personal history of pneumonia (recurrent): Secondary | ICD-10-CM | POA: Diagnosis not present

## 2017-05-22 DIAGNOSIS — J189 Pneumonia, unspecified organism: Secondary | ICD-10-CM | POA: Diagnosis not present

## 2017-05-22 DIAGNOSIS — J9602 Acute respiratory failure with hypercapnia: Secondary | ICD-10-CM | POA: Diagnosis not present

## 2017-05-22 DIAGNOSIS — E039 Hypothyroidism, unspecified: Secondary | ICD-10-CM | POA: Diagnosis not present

## 2017-05-22 DIAGNOSIS — A419 Sepsis, unspecified organism: Secondary | ICD-10-CM | POA: Diagnosis not present

## 2017-05-22 DIAGNOSIS — I272 Pulmonary hypertension, unspecified: Secondary | ICD-10-CM | POA: Diagnosis not present

## 2017-05-22 DIAGNOSIS — I270 Primary pulmonary hypertension: Secondary | ICD-10-CM | POA: Diagnosis not present

## 2017-05-22 DIAGNOSIS — J9601 Acute respiratory failure with hypoxia: Secondary | ICD-10-CM | POA: Diagnosis not present

## 2017-05-22 DIAGNOSIS — G4733 Obstructive sleep apnea (adult) (pediatric): Secondary | ICD-10-CM | POA: Diagnosis not present

## 2017-05-22 DIAGNOSIS — Z452 Encounter for adjustment and management of vascular access device: Secondary | ICD-10-CM | POA: Diagnosis not present

## 2017-05-22 DIAGNOSIS — I129 Hypertensive chronic kidney disease with stage 1 through stage 4 chronic kidney disease, or unspecified chronic kidney disease: Secondary | ICD-10-CM | POA: Diagnosis not present

## 2017-05-22 DIAGNOSIS — N183 Chronic kidney disease, stage 3 (moderate): Secondary | ICD-10-CM | POA: Diagnosis not present

## 2017-05-22 DIAGNOSIS — I482 Chronic atrial fibrillation: Secondary | ICD-10-CM | POA: Diagnosis not present

## 2017-05-22 DIAGNOSIS — Z86711 Personal history of pulmonary embolism: Secondary | ICD-10-CM | POA: Diagnosis not present

## 2017-05-22 DIAGNOSIS — R918 Other nonspecific abnormal finding of lung field: Secondary | ICD-10-CM | POA: Diagnosis not present

## 2017-05-27 DIAGNOSIS — G4733 Obstructive sleep apnea (adult) (pediatric): Secondary | ICD-10-CM | POA: Diagnosis not present

## 2017-05-28 DIAGNOSIS — Z452 Encounter for adjustment and management of vascular access device: Secondary | ICD-10-CM | POA: Diagnosis not present

## 2017-05-28 DIAGNOSIS — I129 Hypertensive chronic kidney disease with stage 1 through stage 4 chronic kidney disease, or unspecified chronic kidney disease: Secondary | ICD-10-CM | POA: Diagnosis not present

## 2017-05-28 DIAGNOSIS — A419 Sepsis, unspecified organism: Secondary | ICD-10-CM | POA: Diagnosis not present

## 2017-05-28 DIAGNOSIS — E039 Hypothyroidism, unspecified: Secondary | ICD-10-CM | POA: Diagnosis not present

## 2017-05-28 DIAGNOSIS — G4733 Obstructive sleep apnea (adult) (pediatric): Secondary | ICD-10-CM | POA: Diagnosis not present

## 2017-05-28 DIAGNOSIS — I482 Chronic atrial fibrillation: Secondary | ICD-10-CM | POA: Diagnosis not present

## 2017-05-28 DIAGNOSIS — I270 Primary pulmonary hypertension: Secondary | ICD-10-CM | POA: Diagnosis not present

## 2017-05-28 DIAGNOSIS — Z8701 Personal history of pneumonia (recurrent): Secondary | ICD-10-CM | POA: Diagnosis not present

## 2017-05-28 DIAGNOSIS — J189 Pneumonia, unspecified organism: Secondary | ICD-10-CM | POA: Diagnosis not present

## 2017-05-28 DIAGNOSIS — I272 Pulmonary hypertension, unspecified: Secondary | ICD-10-CM | POA: Diagnosis not present

## 2017-05-28 DIAGNOSIS — Z86711 Personal history of pulmonary embolism: Secondary | ICD-10-CM | POA: Diagnosis not present

## 2017-05-28 DIAGNOSIS — R918 Other nonspecific abnormal finding of lung field: Secondary | ICD-10-CM | POA: Diagnosis not present

## 2017-05-29 DIAGNOSIS — Z86711 Personal history of pulmonary embolism: Secondary | ICD-10-CM | POA: Diagnosis not present

## 2017-05-29 DIAGNOSIS — J189 Pneumonia, unspecified organism: Secondary | ICD-10-CM | POA: Diagnosis not present

## 2017-05-29 DIAGNOSIS — I272 Pulmonary hypertension, unspecified: Secondary | ICD-10-CM | POA: Diagnosis not present

## 2017-05-29 DIAGNOSIS — E039 Hypothyroidism, unspecified: Secondary | ICD-10-CM | POA: Diagnosis not present

## 2017-05-29 DIAGNOSIS — A419 Sepsis, unspecified organism: Secondary | ICD-10-CM | POA: Diagnosis not present

## 2017-05-29 DIAGNOSIS — Z452 Encounter for adjustment and management of vascular access device: Secondary | ICD-10-CM | POA: Diagnosis not present

## 2017-05-29 DIAGNOSIS — G4730 Sleep apnea, unspecified: Secondary | ICD-10-CM | POA: Diagnosis not present

## 2017-05-29 DIAGNOSIS — R918 Other nonspecific abnormal finding of lung field: Secondary | ICD-10-CM | POA: Diagnosis not present

## 2017-05-29 DIAGNOSIS — I129 Hypertensive chronic kidney disease with stage 1 through stage 4 chronic kidney disease, or unspecified chronic kidney disease: Secondary | ICD-10-CM | POA: Diagnosis not present

## 2017-05-29 DIAGNOSIS — Z8701 Personal history of pneumonia (recurrent): Secondary | ICD-10-CM | POA: Diagnosis not present

## 2017-05-29 DIAGNOSIS — G4733 Obstructive sleep apnea (adult) (pediatric): Secondary | ICD-10-CM | POA: Diagnosis not present

## 2017-05-29 DIAGNOSIS — I482 Chronic atrial fibrillation: Secondary | ICD-10-CM | POA: Diagnosis not present

## 2017-05-31 DIAGNOSIS — E039 Hypothyroidism, unspecified: Secondary | ICD-10-CM | POA: Diagnosis not present

## 2017-05-31 DIAGNOSIS — R350 Frequency of micturition: Secondary | ICD-10-CM | POA: Diagnosis not present

## 2017-05-31 DIAGNOSIS — I482 Chronic atrial fibrillation: Secondary | ICD-10-CM | POA: Diagnosis not present

## 2017-05-31 DIAGNOSIS — R918 Other nonspecific abnormal finding of lung field: Secondary | ICD-10-CM | POA: Diagnosis not present

## 2017-05-31 DIAGNOSIS — J9601 Acute respiratory failure with hypoxia: Secondary | ICD-10-CM | POA: Diagnosis not present

## 2017-05-31 DIAGNOSIS — I272 Pulmonary hypertension, unspecified: Secondary | ICD-10-CM | POA: Diagnosis not present

## 2017-05-31 DIAGNOSIS — Z86711 Personal history of pulmonary embolism: Secondary | ICD-10-CM | POA: Diagnosis not present

## 2017-05-31 DIAGNOSIS — G4733 Obstructive sleep apnea (adult) (pediatric): Secondary | ICD-10-CM | POA: Diagnosis not present

## 2017-05-31 DIAGNOSIS — J9602 Acute respiratory failure with hypercapnia: Secondary | ICD-10-CM | POA: Diagnosis not present

## 2017-05-31 DIAGNOSIS — N183 Chronic kidney disease, stage 3 (moderate): Secondary | ICD-10-CM | POA: Diagnosis not present

## 2017-05-31 DIAGNOSIS — B373 Candidiasis of vulva and vagina: Secondary | ICD-10-CM | POA: Diagnosis not present

## 2017-05-31 DIAGNOSIS — Z79899 Other long term (current) drug therapy: Secondary | ICD-10-CM | POA: Diagnosis not present

## 2017-05-31 DIAGNOSIS — Z8701 Personal history of pneumonia (recurrent): Secondary | ICD-10-CM | POA: Diagnosis not present

## 2017-05-31 DIAGNOSIS — L298 Other pruritus: Secondary | ICD-10-CM | POA: Diagnosis not present

## 2017-05-31 DIAGNOSIS — I129 Hypertensive chronic kidney disease with stage 1 through stage 4 chronic kidney disease, or unspecified chronic kidney disease: Secondary | ICD-10-CM | POA: Diagnosis not present

## 2017-06-07 DIAGNOSIS — Z6841 Body Mass Index (BMI) 40.0 and over, adult: Secondary | ICD-10-CM | POA: Diagnosis not present

## 2017-06-07 DIAGNOSIS — R0600 Dyspnea, unspecified: Secondary | ICD-10-CM | POA: Diagnosis not present

## 2017-06-07 DIAGNOSIS — I110 Hypertensive heart disease with heart failure: Secondary | ICD-10-CM | POA: Diagnosis not present

## 2017-06-07 DIAGNOSIS — I481 Persistent atrial fibrillation: Secondary | ICD-10-CM | POA: Diagnosis not present

## 2017-06-07 DIAGNOSIS — I495 Sick sinus syndrome: Secondary | ICD-10-CM | POA: Diagnosis not present

## 2017-06-07 DIAGNOSIS — K5730 Diverticulosis of large intestine without perforation or abscess without bleeding: Secondary | ICD-10-CM | POA: Diagnosis not present

## 2017-06-07 DIAGNOSIS — Z8249 Family history of ischemic heart disease and other diseases of the circulatory system: Secondary | ICD-10-CM | POA: Diagnosis not present

## 2017-06-07 DIAGNOSIS — E876 Hypokalemia: Secondary | ICD-10-CM | POA: Diagnosis not present

## 2017-06-07 DIAGNOSIS — K449 Diaphragmatic hernia without obstruction or gangrene: Secondary | ICD-10-CM | POA: Diagnosis not present

## 2017-06-07 DIAGNOSIS — J189 Pneumonia, unspecified organism: Secondary | ICD-10-CM | POA: Diagnosis not present

## 2017-06-07 DIAGNOSIS — I270 Primary pulmonary hypertension: Secondary | ICD-10-CM | POA: Diagnosis not present

## 2017-06-07 DIAGNOSIS — Z6838 Body mass index (BMI) 38.0-38.9, adult: Secondary | ICD-10-CM | POA: Diagnosis not present

## 2017-06-07 DIAGNOSIS — D62 Acute posthemorrhagic anemia: Secondary | ICD-10-CM | POA: Diagnosis not present

## 2017-06-07 DIAGNOSIS — J9601 Acute respiratory failure with hypoxia: Secondary | ICD-10-CM | POA: Diagnosis not present

## 2017-06-07 DIAGNOSIS — Z950 Presence of cardiac pacemaker: Secondary | ICD-10-CM | POA: Diagnosis not present

## 2017-06-07 DIAGNOSIS — K6289 Other specified diseases of anus and rectum: Secondary | ICD-10-CM | POA: Diagnosis not present

## 2017-06-07 DIAGNOSIS — I4892 Unspecified atrial flutter: Secondary | ICD-10-CM | POA: Diagnosis not present

## 2017-06-07 DIAGNOSIS — I272 Pulmonary hypertension, unspecified: Secondary | ICD-10-CM | POA: Diagnosis not present

## 2017-06-07 DIAGNOSIS — R531 Weakness: Secondary | ICD-10-CM | POA: Diagnosis not present

## 2017-06-07 DIAGNOSIS — K2930 Chronic superficial gastritis without bleeding: Secondary | ICD-10-CM | POA: Diagnosis not present

## 2017-06-07 DIAGNOSIS — K922 Gastrointestinal hemorrhage, unspecified: Secondary | ICD-10-CM | POA: Diagnosis not present

## 2017-06-07 DIAGNOSIS — D120 Benign neoplasm of cecum: Secondary | ICD-10-CM | POA: Diagnosis not present

## 2017-06-07 DIAGNOSIS — K259 Gastric ulcer, unspecified as acute or chronic, without hemorrhage or perforation: Secondary | ICD-10-CM | POA: Diagnosis not present

## 2017-06-07 DIAGNOSIS — I447 Left bundle-branch block, unspecified: Secondary | ICD-10-CM | POA: Diagnosis not present

## 2017-06-07 DIAGNOSIS — R918 Other nonspecific abnormal finding of lung field: Secondary | ICD-10-CM | POA: Diagnosis not present

## 2017-06-07 DIAGNOSIS — K635 Polyp of colon: Secondary | ICD-10-CM | POA: Diagnosis not present

## 2017-06-07 DIAGNOSIS — K2970 Gastritis, unspecified, without bleeding: Secondary | ICD-10-CM | POA: Diagnosis not present

## 2017-06-07 DIAGNOSIS — I129 Hypertensive chronic kidney disease with stage 1 through stage 4 chronic kidney disease, or unspecified chronic kidney disease: Secondary | ICD-10-CM | POA: Diagnosis not present

## 2017-06-07 DIAGNOSIS — K219 Gastro-esophageal reflux disease without esophagitis: Secondary | ICD-10-CM | POA: Diagnosis not present

## 2017-06-07 DIAGNOSIS — I2699 Other pulmonary embolism without acute cor pulmonale: Secondary | ICD-10-CM | POA: Diagnosis not present

## 2017-06-07 DIAGNOSIS — E039 Hypothyroidism, unspecified: Secondary | ICD-10-CM | POA: Diagnosis not present

## 2017-06-07 DIAGNOSIS — R7303 Prediabetes: Secondary | ICD-10-CM | POA: Diagnosis not present

## 2017-06-07 DIAGNOSIS — D500 Iron deficiency anemia secondary to blood loss (chronic): Secondary | ICD-10-CM | POA: Diagnosis not present

## 2017-06-07 DIAGNOSIS — Z8719 Personal history of other diseases of the digestive system: Secondary | ICD-10-CM | POA: Diagnosis not present

## 2017-06-07 DIAGNOSIS — J9811 Atelectasis: Secondary | ICD-10-CM | POA: Diagnosis not present

## 2017-06-07 DIAGNOSIS — N179 Acute kidney failure, unspecified: Secondary | ICD-10-CM | POA: Diagnosis not present

## 2017-06-07 DIAGNOSIS — I130 Hypertensive heart and chronic kidney disease with heart failure and stage 1 through stage 4 chronic kidney disease, or unspecified chronic kidney disease: Secondary | ICD-10-CM | POA: Diagnosis not present

## 2017-06-07 DIAGNOSIS — J381 Polyp of vocal cord and larynx: Secondary | ICD-10-CM | POA: Diagnosis not present

## 2017-06-07 DIAGNOSIS — Z86711 Personal history of pulmonary embolism: Secondary | ICD-10-CM | POA: Diagnosis not present

## 2017-06-07 DIAGNOSIS — D649 Anemia, unspecified: Secondary | ICD-10-CM | POA: Diagnosis not present

## 2017-06-07 DIAGNOSIS — R112 Nausea with vomiting, unspecified: Secondary | ICD-10-CM | POA: Diagnosis not present

## 2017-06-07 DIAGNOSIS — N2889 Other specified disorders of kidney and ureter: Secondary | ICD-10-CM | POA: Diagnosis not present

## 2017-06-07 DIAGNOSIS — I4891 Unspecified atrial fibrillation: Secondary | ICD-10-CM | POA: Diagnosis not present

## 2017-06-07 DIAGNOSIS — J9602 Acute respiratory failure with hypercapnia: Secondary | ICD-10-CM | POA: Diagnosis not present

## 2017-06-07 DIAGNOSIS — Z8701 Personal history of pneumonia (recurrent): Secondary | ICD-10-CM | POA: Diagnosis not present

## 2017-06-07 DIAGNOSIS — D638 Anemia in other chronic diseases classified elsewhere: Secondary | ICD-10-CM | POA: Diagnosis not present

## 2017-06-07 DIAGNOSIS — I5020 Unspecified systolic (congestive) heart failure: Secondary | ICD-10-CM | POA: Diagnosis not present

## 2017-06-07 DIAGNOSIS — K210 Gastro-esophageal reflux disease with esophagitis: Secondary | ICD-10-CM | POA: Diagnosis not present

## 2017-06-07 DIAGNOSIS — I509 Heart failure, unspecified: Secondary | ICD-10-CM | POA: Diagnosis not present

## 2017-06-07 DIAGNOSIS — R0602 Shortness of breath: Secondary | ICD-10-CM | POA: Diagnosis not present

## 2017-06-07 DIAGNOSIS — N183 Chronic kidney disease, stage 3 (moderate): Secondary | ICD-10-CM | POA: Diagnosis not present

## 2017-06-07 DIAGNOSIS — K3189 Other diseases of stomach and duodenum: Secondary | ICD-10-CM | POA: Diagnosis not present

## 2017-06-07 DIAGNOSIS — D125 Benign neoplasm of sigmoid colon: Secondary | ICD-10-CM | POA: Diagnosis not present

## 2017-06-07 DIAGNOSIS — K648 Other hemorrhoids: Secondary | ICD-10-CM | POA: Diagnosis not present

## 2017-06-07 DIAGNOSIS — K317 Polyp of stomach and duodenum: Secondary | ICD-10-CM | POA: Diagnosis not present

## 2017-06-07 DIAGNOSIS — Z6839 Body mass index (BMI) 39.0-39.9, adult: Secondary | ICD-10-CM | POA: Diagnosis not present

## 2017-06-07 DIAGNOSIS — Z7901 Long term (current) use of anticoagulants: Secondary | ICD-10-CM | POA: Diagnosis not present

## 2017-06-07 DIAGNOSIS — G4733 Obstructive sleep apnea (adult) (pediatric): Secondary | ICD-10-CM | POA: Diagnosis not present

## 2017-06-07 DIAGNOSIS — I5033 Acute on chronic diastolic (congestive) heart failure: Secondary | ICD-10-CM | POA: Diagnosis not present

## 2017-06-07 DIAGNOSIS — K209 Esophagitis, unspecified: Secondary | ICD-10-CM | POA: Diagnosis not present

## 2017-06-07 DIAGNOSIS — I482 Chronic atrial fibrillation: Secondary | ICD-10-CM | POA: Diagnosis not present

## 2017-06-07 DIAGNOSIS — R195 Other fecal abnormalities: Secondary | ICD-10-CM | POA: Diagnosis not present

## 2017-06-08 DIAGNOSIS — I4891 Unspecified atrial fibrillation: Secondary | ICD-10-CM | POA: Diagnosis not present

## 2017-06-11 DIAGNOSIS — I5020 Unspecified systolic (congestive) heart failure: Secondary | ICD-10-CM | POA: Diagnosis not present

## 2017-06-11 DIAGNOSIS — D500 Iron deficiency anemia secondary to blood loss (chronic): Secondary | ICD-10-CM | POA: Diagnosis not present

## 2017-06-11 DIAGNOSIS — K635 Polyp of colon: Secondary | ICD-10-CM | POA: Diagnosis not present

## 2017-06-20 ENCOUNTER — Other Ambulatory Visit: Payer: Self-pay | Admitting: *Deleted

## 2017-06-20 ENCOUNTER — Other Ambulatory Visit: Payer: Self-pay

## 2017-06-20 DIAGNOSIS — D62 Acute posthemorrhagic anemia: Secondary | ICD-10-CM | POA: Diagnosis not present

## 2017-06-20 DIAGNOSIS — Z7901 Long term (current) use of anticoagulants: Secondary | ICD-10-CM | POA: Diagnosis not present

## 2017-06-20 DIAGNOSIS — I482 Chronic atrial fibrillation: Secondary | ICD-10-CM | POA: Diagnosis not present

## 2017-06-20 DIAGNOSIS — Z950 Presence of cardiac pacemaker: Secondary | ICD-10-CM | POA: Diagnosis not present

## 2017-06-20 DIAGNOSIS — J381 Polyp of vocal cord and larynx: Secondary | ICD-10-CM | POA: Diagnosis not present

## 2017-06-20 DIAGNOSIS — E039 Hypothyroidism, unspecified: Secondary | ICD-10-CM | POA: Diagnosis not present

## 2017-06-20 DIAGNOSIS — I130 Hypertensive heart and chronic kidney disease with heart failure and stage 1 through stage 4 chronic kidney disease, or unspecified chronic kidney disease: Secondary | ICD-10-CM | POA: Diagnosis not present

## 2017-06-20 DIAGNOSIS — Z8614 Personal history of Methicillin resistant Staphylococcus aureus infection: Secondary | ICD-10-CM | POA: Diagnosis not present

## 2017-06-20 DIAGNOSIS — I5033 Acute on chronic diastolic (congestive) heart failure: Secondary | ICD-10-CM | POA: Diagnosis not present

## 2017-06-20 DIAGNOSIS — K922 Gastrointestinal hemorrhage, unspecified: Secondary | ICD-10-CM | POA: Diagnosis not present

## 2017-06-20 DIAGNOSIS — N183 Chronic kidney disease, stage 3 (moderate): Secondary | ICD-10-CM | POA: Diagnosis not present

## 2017-06-20 DIAGNOSIS — Z86711 Personal history of pulmonary embolism: Secondary | ICD-10-CM | POA: Diagnosis not present

## 2017-06-20 NOTE — Patient Outreach (Signed)
[facility]) Care Management  06/20/2017  Kristen Krause [birth year] [id number]  Transition of care  Week # 1 Referral date: 06/20/17 Referral source: Insurance referral Program Transition of care  Insurance: [employer] Providers: Sinclair Ship, MD Social support: Patient lives with spouse, Kristen Krause   SUBJECTIVE: Telephone call to patient regarding transition of care follow up. HIPAA verified with patient. RNCM discussed and offered transition of care follow up. Patient verbally agreed.    Patient states she  has had 4 admission since May 2018.  Patient reports she has heart failure and atrial fibrillation.  Most recent discharge date 06/18/17 for atrial fibrillation. 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 Patient states she was inpatient at [facility].  [facility].  Patient states she is on oxygen at night with CPAP.  Patient called her primary MD on Monday or Tuesday after discharge and requested follow up appointment.  Patient was told she office would have to call her back. Patient states she  is unhappy with her primary provider. RNCM advised her to call her concierge service with [employer] for in plan providers. PLAN:  RNCM will refer patient to [facility] community case manager.  Quinn Plowman RN,BSN,CCM [facility] Telephonic  [phone]

## 2017-06-20 NOTE — Patient Outreach (Signed)
[facility]) Care Management  06/20/2017  Kristen Krause 1941/01/27 [id number]   Referral received 8/22 for Transition of care (Initial call)  RN spoke with pt today and confirmed identifiers. Introduced the [facility] program and purpose for today's call. Pt very receptive and proceeded to explain her involved with [facility] and possible needs. Pt state there is a nurse visiting however not sure exactly what is being address. States this agency was involved prior to her last hospitalization. RN review some of her medical history and her recent pacemaker placement. Further discussed HF as a topic of concerning due to pt's lack of knowledge with her recent pacemaker placement. Educated pt on the HF zones and verified pt is currently in the GREEN zone with no reported problems at this time. Offered to further engage with this pt with available home visits however pt not sure of the scheduled appointments with the [facility] agency but will have a better idea by over the next few days. Pt remains apprehensive about involvement but receptive to the ongoing telephone transition of care calls once educated on the purpose. RN offered to follow up next week to further discuss her HF and possible engage further with a home visit as she thinks it over with the current services. RN discussed a plan of care with involved goals as pt receptive and willing to participate with the plan of care. Offered to review and further discuss how she can meet such goals through ongoing educations and self management of care. Pt again very receptive and agreed to a follow up call next week for ongoing transition of care with possible involvement with community home visits. RN also informed pt of other services with [facility] for pharmacy assistance and/or social work needs for any community resources if needed at this time (none needed at this time).  Will follow up at that time and schedule a telephone appointment accordingly. Pt started  in both the transition of care and disease management program for HF today.   Patient was recently discharged from hospital and all medications have been reviewed.  [name], RN Care Management Coordinator [place] Office [phone]

## 2017-06-21 DIAGNOSIS — I130 Hypertensive heart and chronic kidney disease with heart failure and stage 1 through stage 4 chronic kidney disease, or unspecified chronic kidney disease: Secondary | ICD-10-CM | POA: Diagnosis not present

## 2017-06-21 DIAGNOSIS — Z8614 Personal history of Methicillin resistant Staphylococcus aureus infection: Secondary | ICD-10-CM | POA: Diagnosis not present

## 2017-06-21 DIAGNOSIS — I482 Chronic atrial fibrillation: Secondary | ICD-10-CM | POA: Diagnosis not present

## 2017-06-21 DIAGNOSIS — J381 Polyp of vocal cord and larynx: Secondary | ICD-10-CM | POA: Diagnosis not present

## 2017-06-21 DIAGNOSIS — K922 Gastrointestinal hemorrhage, unspecified: Secondary | ICD-10-CM | POA: Diagnosis not present

## 2017-06-21 DIAGNOSIS — I5033 Acute on chronic diastolic (congestive) heart failure: Secondary | ICD-10-CM | POA: Diagnosis not present

## 2017-06-21 DIAGNOSIS — Z7901 Long term (current) use of anticoagulants: Secondary | ICD-10-CM | POA: Diagnosis not present

## 2017-06-21 DIAGNOSIS — D62 Acute posthemorrhagic anemia: Secondary | ICD-10-CM | POA: Diagnosis not present

## 2017-06-21 DIAGNOSIS — E039 Hypothyroidism, unspecified: Secondary | ICD-10-CM | POA: Diagnosis not present

## 2017-06-21 DIAGNOSIS — N183 Chronic kidney disease, stage 3 (moderate): Secondary | ICD-10-CM | POA: Diagnosis not present

## 2017-06-21 DIAGNOSIS — Z86711 Personal history of pulmonary embolism: Secondary | ICD-10-CM | POA: Diagnosis not present

## 2017-06-21 DIAGNOSIS — Z950 Presence of cardiac pacemaker: Secondary | ICD-10-CM | POA: Diagnosis not present

## 2017-06-22 DIAGNOSIS — D631 Anemia in chronic kidney disease: Secondary | ICD-10-CM | POA: Diagnosis not present

## 2017-06-22 DIAGNOSIS — N179 Acute kidney failure, unspecified: Secondary | ICD-10-CM | POA: Diagnosis not present

## 2017-06-22 DIAGNOSIS — I129 Hypertensive chronic kidney disease with stage 1 through stage 4 chronic kidney disease, or unspecified chronic kidney disease: Secondary | ICD-10-CM | POA: Diagnosis not present

## 2017-06-22 DIAGNOSIS — N189 Chronic kidney disease, unspecified: Secondary | ICD-10-CM | POA: Diagnosis not present

## 2017-06-25 DIAGNOSIS — E039 Hypothyroidism, unspecified: Secondary | ICD-10-CM | POA: Diagnosis not present

## 2017-06-25 DIAGNOSIS — Z86711 Personal history of pulmonary embolism: Secondary | ICD-10-CM | POA: Diagnosis not present

## 2017-06-25 DIAGNOSIS — D62 Acute posthemorrhagic anemia: Secondary | ICD-10-CM | POA: Diagnosis not present

## 2017-06-25 DIAGNOSIS — I5033 Acute on chronic diastolic (congestive) heart failure: Secondary | ICD-10-CM | POA: Diagnosis not present

## 2017-06-25 DIAGNOSIS — I482 Chronic atrial fibrillation: Secondary | ICD-10-CM | POA: Diagnosis not present

## 2017-06-25 DIAGNOSIS — K922 Gastrointestinal hemorrhage, unspecified: Secondary | ICD-10-CM | POA: Diagnosis not present

## 2017-06-25 DIAGNOSIS — N183 Chronic kidney disease, stage 3 (moderate): Secondary | ICD-10-CM | POA: Diagnosis not present

## 2017-06-25 DIAGNOSIS — J381 Polyp of vocal cord and larynx: Secondary | ICD-10-CM | POA: Diagnosis not present

## 2017-06-25 DIAGNOSIS — Z950 Presence of cardiac pacemaker: Secondary | ICD-10-CM | POA: Diagnosis not present

## 2017-06-25 DIAGNOSIS — Z7901 Long term (current) use of anticoagulants: Secondary | ICD-10-CM | POA: Diagnosis not present

## 2017-06-25 DIAGNOSIS — I130 Hypertensive heart and chronic kidney disease with heart failure and stage 1 through stage 4 chronic kidney disease, or unspecified chronic kidney disease: Secondary | ICD-10-CM | POA: Diagnosis not present

## 2017-06-25 DIAGNOSIS — Z8614 Personal history of Methicillin resistant Staphylococcus aureus infection: Secondary | ICD-10-CM | POA: Diagnosis not present

## 2017-06-27 ENCOUNTER — Other Ambulatory Visit: Payer: Self-pay | Admitting: *Deleted

## 2017-06-27 DIAGNOSIS — G4733 Obstructive sleep apnea (adult) (pediatric): Secondary | ICD-10-CM | POA: Diagnosis not present

## 2017-06-27 DIAGNOSIS — I495 Sick sinus syndrome: Secondary | ICD-10-CM | POA: Diagnosis not present

## 2017-06-27 DIAGNOSIS — Z45018 Encounter for adjustment and management of other part of cardiac pacemaker: Secondary | ICD-10-CM | POA: Diagnosis not present

## 2017-06-27 NOTE — Patient Outreach (Addendum)
[facility]) Care Management  06/27/2017  Kristen Krause May 28, 1941 [id number]   Transition of care  RN spoke with pt and reintroduced the [facility] services once identifiers confirmed. Pt remains receptive and indicates [facility] has started with therapies. RN reiterated on her HF and confirmed weights yesterday at 226.1 lbs, today 225.8 lbs and last week recorded was 226 lbs. Pt denies any symptoms and RN confirms pt remains in the GREEN zone with no reported swelling or precipitating symptoms. RN will continue to encouraged adherence with her daily weights and HF management of care. RN inquired about arranging a home visit however pt has opted to ongoing telephone care related to the transition of care only at this time due to her appointments and involvement with [facility] services. Pt is aware that [facility] does not interfere with her [facility] services however remains receptive once again to telephone disease management at this time with transition of care contacts. RN review the plan of care and goals as pt remains active with participation and will continue to adhere. Pt has established her new primary care provider and visited last week with Dr. Redmond Pulling however could not remember the doctor first name but will provide such information on the next call. Pt states she has an appointment today and must end the call early but will be available next week to completed the requested assessment information and confirm her new providers information. Will scheduled another follow up call next week for ongoing transition of care contact.  [name], RN Care Management Coordinator [place] Office [phone]

## 2017-06-28 DIAGNOSIS — Z7901 Long term (current) use of anticoagulants: Secondary | ICD-10-CM | POA: Diagnosis not present

## 2017-06-28 DIAGNOSIS — J381 Polyp of vocal cord and larynx: Secondary | ICD-10-CM | POA: Diagnosis not present

## 2017-06-28 DIAGNOSIS — N183 Chronic kidney disease, stage 3 (moderate): Secondary | ICD-10-CM | POA: Diagnosis not present

## 2017-06-28 DIAGNOSIS — Z86711 Personal history of pulmonary embolism: Secondary | ICD-10-CM | POA: Diagnosis not present

## 2017-06-28 DIAGNOSIS — E039 Hypothyroidism, unspecified: Secondary | ICD-10-CM | POA: Diagnosis not present

## 2017-06-28 DIAGNOSIS — Z950 Presence of cardiac pacemaker: Secondary | ICD-10-CM | POA: Diagnosis not present

## 2017-06-28 DIAGNOSIS — D62 Acute posthemorrhagic anemia: Secondary | ICD-10-CM | POA: Diagnosis not present

## 2017-06-28 DIAGNOSIS — K922 Gastrointestinal hemorrhage, unspecified: Secondary | ICD-10-CM | POA: Diagnosis not present

## 2017-06-28 DIAGNOSIS — I5033 Acute on chronic diastolic (congestive) heart failure: Secondary | ICD-10-CM | POA: Diagnosis not present

## 2017-06-28 DIAGNOSIS — I130 Hypertensive heart and chronic kidney disease with heart failure and stage 1 through stage 4 chronic kidney disease, or unspecified chronic kidney disease: Secondary | ICD-10-CM | POA: Diagnosis not present

## 2017-06-28 DIAGNOSIS — Z8614 Personal history of Methicillin resistant Staphylococcus aureus infection: Secondary | ICD-10-CM | POA: Diagnosis not present

## 2017-06-28 DIAGNOSIS — I482 Chronic atrial fibrillation: Secondary | ICD-10-CM | POA: Diagnosis not present

## 2017-06-29 DIAGNOSIS — J381 Polyp of vocal cord and larynx: Secondary | ICD-10-CM | POA: Diagnosis not present

## 2017-06-29 DIAGNOSIS — Z950 Presence of cardiac pacemaker: Secondary | ICD-10-CM | POA: Diagnosis not present

## 2017-06-29 DIAGNOSIS — K922 Gastrointestinal hemorrhage, unspecified: Secondary | ICD-10-CM | POA: Diagnosis not present

## 2017-06-29 DIAGNOSIS — E039 Hypothyroidism, unspecified: Secondary | ICD-10-CM | POA: Diagnosis not present

## 2017-06-29 DIAGNOSIS — Z86711 Personal history of pulmonary embolism: Secondary | ICD-10-CM | POA: Diagnosis not present

## 2017-06-29 DIAGNOSIS — I5033 Acute on chronic diastolic (congestive) heart failure: Secondary | ICD-10-CM | POA: Diagnosis not present

## 2017-06-29 DIAGNOSIS — I482 Chronic atrial fibrillation: Secondary | ICD-10-CM | POA: Diagnosis not present

## 2017-06-29 DIAGNOSIS — I130 Hypertensive heart and chronic kidney disease with heart failure and stage 1 through stage 4 chronic kidney disease, or unspecified chronic kidney disease: Secondary | ICD-10-CM | POA: Diagnosis not present

## 2017-06-29 DIAGNOSIS — Z8614 Personal history of Methicillin resistant Staphylococcus aureus infection: Secondary | ICD-10-CM | POA: Diagnosis not present

## 2017-06-29 DIAGNOSIS — D62 Acute posthemorrhagic anemia: Secondary | ICD-10-CM | POA: Diagnosis not present

## 2017-06-29 DIAGNOSIS — N183 Chronic kidney disease, stage 3 (moderate): Secondary | ICD-10-CM | POA: Diagnosis not present

## 2017-06-29 DIAGNOSIS — G4730 Sleep apnea, unspecified: Secondary | ICD-10-CM | POA: Diagnosis not present

## 2017-06-29 DIAGNOSIS — Z7901 Long term (current) use of anticoagulants: Secondary | ICD-10-CM | POA: Diagnosis not present

## 2017-07-03 ENCOUNTER — Encounter: Payer: Self-pay | Admitting: *Deleted

## 2017-07-03 ENCOUNTER — Other Ambulatory Visit: Payer: Self-pay | Admitting: *Deleted

## 2017-07-03 DIAGNOSIS — Z950 Presence of cardiac pacemaker: Secondary | ICD-10-CM | POA: Diagnosis not present

## 2017-07-03 DIAGNOSIS — I4891 Unspecified atrial fibrillation: Secondary | ICD-10-CM

## 2017-07-03 DIAGNOSIS — I10 Essential (primary) hypertension: Secondary | ICD-10-CM

## 2017-07-03 DIAGNOSIS — G4730 Sleep apnea, unspecified: Secondary | ICD-10-CM | POA: Diagnosis not present

## 2017-07-03 DIAGNOSIS — I447 Left bundle-branch block, unspecified: Secondary | ICD-10-CM | POA: Diagnosis not present

## 2017-07-03 DIAGNOSIS — I509 Heart failure, unspecified: Secondary | ICD-10-CM

## 2017-07-03 DIAGNOSIS — I481 Persistent atrial fibrillation: Secondary | ICD-10-CM | POA: Diagnosis not present

## 2017-07-03 NOTE — Patient Outreach (Addendum)
[place] [facility]) Care Management  07/03/2017  Aubrea Meixner [birth year] [id number]   Transition of care  RN spoke with pt today and verified identifiers. RN inquired further on pt's management of care related to her HF. Pt confirms she remains in the GREEN zone once zones reviewed and reports her weights on yesterday at 226.1 lbs and today 227 lbs with no reported symptoms since RN last spoke with pt last week. Pt also indicates issues constipation however passing gas and reports some small movement of her bowls. RN discussed OTC medication that involved a stool softners, laxtative and some contain both depending on her situation. RN stress if more than 3 days has passed with no result and/or pain is involved pt aware to contact her provider (pt with understanding). RN able to completed the initial assessment information however pt did not have her medication review list but will attempt to obtain by next transition of care call next week to review more in detail and confirm pt's medications. RN will discussed available community resources and offered once again community home visits if needed. No visits accepted at this time as RN will continue to review the plan of care and goals generated earlier. Pt confirms she continue to adhere to the plan of care with no delays or needs at this time however pt has inquired on possible cardiac rehabilitation coverage. RN encourage pt to contact her insurance and speak with her provider prior to initiating enrolling. Pt is aware that her provider will need to order this based upon her condition and purpose for requesting this services. Pt also aware that she may incur fee for services related to possible co-pays. No other inquires or request at this time. Will follow up accordingly.  [name], RN Care Management Coordinator [place] Office [phone]

## 2017-07-04 DIAGNOSIS — Z86711 Personal history of pulmonary embolism: Secondary | ICD-10-CM | POA: Diagnosis not present

## 2017-07-04 DIAGNOSIS — K219 Gastro-esophageal reflux disease without esophagitis: Secondary | ICD-10-CM | POA: Diagnosis not present

## 2017-07-04 DIAGNOSIS — J387 Other diseases of larynx: Secondary | ICD-10-CM | POA: Diagnosis not present

## 2017-07-05 DIAGNOSIS — Z888 Allergy status to other drugs, medicaments and biological substances status: Secondary | ICD-10-CM | POA: Diagnosis not present

## 2017-07-05 DIAGNOSIS — N183 Chronic kidney disease, stage 3 (moderate): Secondary | ICD-10-CM | POA: Diagnosis not present

## 2017-07-05 DIAGNOSIS — N189 Chronic kidney disease, unspecified: Secondary | ICD-10-CM | POA: Diagnosis not present

## 2017-07-05 DIAGNOSIS — J90 Pleural effusion, not elsewhere classified: Secondary | ICD-10-CM | POA: Diagnosis not present

## 2017-07-05 DIAGNOSIS — Z86711 Personal history of pulmonary embolism: Secondary | ICD-10-CM | POA: Diagnosis not present

## 2017-07-05 DIAGNOSIS — I1310 Hypertensive heart and chronic kidney disease without heart failure, with stage 1 through stage 4 chronic kidney disease, or unspecified chronic kidney disease: Secondary | ICD-10-CM | POA: Diagnosis not present

## 2017-07-05 DIAGNOSIS — R0602 Shortness of breath: Secondary | ICD-10-CM | POA: Diagnosis not present

## 2017-07-05 DIAGNOSIS — I10 Essential (primary) hypertension: Secondary | ICD-10-CM | POA: Diagnosis not present

## 2017-07-05 DIAGNOSIS — J810 Acute pulmonary edema: Secondary | ICD-10-CM | POA: Diagnosis not present

## 2017-07-05 DIAGNOSIS — I509 Heart failure, unspecified: Secondary | ICD-10-CM | POA: Diagnosis not present

## 2017-07-05 DIAGNOSIS — I5033 Acute on chronic diastolic (congestive) heart failure: Secondary | ICD-10-CM | POA: Diagnosis not present

## 2017-07-05 DIAGNOSIS — Z7901 Long term (current) use of anticoagulants: Secondary | ICD-10-CM | POA: Diagnosis not present

## 2017-07-05 DIAGNOSIS — I272 Pulmonary hypertension, unspecified: Secondary | ICD-10-CM | POA: Diagnosis not present

## 2017-07-05 DIAGNOSIS — I161 Hypertensive emergency: Secondary | ICD-10-CM | POA: Diagnosis not present

## 2017-07-05 DIAGNOSIS — G4730 Sleep apnea, unspecified: Secondary | ICD-10-CM | POA: Diagnosis not present

## 2017-07-05 DIAGNOSIS — G4733 Obstructive sleep apnea (adult) (pediatric): Secondary | ICD-10-CM | POA: Diagnosis not present

## 2017-07-05 DIAGNOSIS — N184 Chronic kidney disease, stage 4 (severe): Secondary | ICD-10-CM | POA: Diagnosis not present

## 2017-07-05 DIAGNOSIS — I481 Persistent atrial fibrillation: Secondary | ICD-10-CM | POA: Diagnosis not present

## 2017-07-05 DIAGNOSIS — I4891 Unspecified atrial fibrillation: Secondary | ICD-10-CM | POA: Diagnosis not present

## 2017-07-05 DIAGNOSIS — I482 Chronic atrial fibrillation: Secondary | ICD-10-CM | POA: Diagnosis not present

## 2017-07-05 DIAGNOSIS — I130 Hypertensive heart and chronic kidney disease with heart failure and stage 1 through stage 4 chronic kidney disease, or unspecified chronic kidney disease: Secondary | ICD-10-CM | POA: Diagnosis not present

## 2017-07-05 DIAGNOSIS — J9601 Acute respiratory failure with hypoxia: Secondary | ICD-10-CM | POA: Diagnosis not present

## 2017-07-06 DIAGNOSIS — I4891 Unspecified atrial fibrillation: Secondary | ICD-10-CM | POA: Diagnosis not present

## 2017-07-07 DIAGNOSIS — J90 Pleural effusion, not elsewhere classified: Secondary | ICD-10-CM | POA: Diagnosis not present

## 2017-07-09 ENCOUNTER — Other Ambulatory Visit: Payer: Self-pay | Admitting: *Deleted

## 2017-07-09 DIAGNOSIS — J381 Polyp of vocal cord and larynx: Secondary | ICD-10-CM | POA: Diagnosis not present

## 2017-07-09 DIAGNOSIS — D62 Acute posthemorrhagic anemia: Secondary | ICD-10-CM | POA: Diagnosis not present

## 2017-07-09 DIAGNOSIS — Z950 Presence of cardiac pacemaker: Secondary | ICD-10-CM | POA: Diagnosis not present

## 2017-07-09 DIAGNOSIS — N179 Acute kidney failure, unspecified: Secondary | ICD-10-CM | POA: Diagnosis not present

## 2017-07-09 DIAGNOSIS — I129 Hypertensive chronic kidney disease with stage 1 through stage 4 chronic kidney disease, or unspecified chronic kidney disease: Secondary | ICD-10-CM | POA: Diagnosis not present

## 2017-07-09 DIAGNOSIS — N183 Chronic kidney disease, stage 3 (moderate): Secondary | ICD-10-CM | POA: Diagnosis not present

## 2017-07-09 DIAGNOSIS — Z86711 Personal history of pulmonary embolism: Secondary | ICD-10-CM | POA: Diagnosis not present

## 2017-07-09 DIAGNOSIS — I130 Hypertensive heart and chronic kidney disease with heart failure and stage 1 through stage 4 chronic kidney disease, or unspecified chronic kidney disease: Secondary | ICD-10-CM | POA: Diagnosis not present

## 2017-07-09 DIAGNOSIS — I482 Chronic atrial fibrillation: Secondary | ICD-10-CM | POA: Diagnosis not present

## 2017-07-09 DIAGNOSIS — I5033 Acute on chronic diastolic (congestive) heart failure: Secondary | ICD-10-CM | POA: Diagnosis not present

## 2017-07-09 DIAGNOSIS — Z7901 Long term (current) use of anticoagulants: Secondary | ICD-10-CM | POA: Diagnosis not present

## 2017-07-09 DIAGNOSIS — Z8614 Personal history of Methicillin resistant Staphylococcus aureus infection: Secondary | ICD-10-CM | POA: Diagnosis not present

## 2017-07-09 DIAGNOSIS — E669 Obesity, unspecified: Secondary | ICD-10-CM | POA: Diagnosis not present

## 2017-07-09 DIAGNOSIS — K922 Gastrointestinal hemorrhage, unspecified: Secondary | ICD-10-CM | POA: Diagnosis not present

## 2017-07-09 DIAGNOSIS — E039 Hypothyroidism, unspecified: Secondary | ICD-10-CM | POA: Diagnosis not present

## 2017-07-09 NOTE — Patient Outreach (Signed)
[place] [facility]) Care Management  07/09/2017  Kristen Krause February 21, 1941 [id number]   Transition of Care  RN spoke with pt today and confirmed identifiers. RN inquired on the pt's medication list as discussed last week however pt reports she had an admission from 9/6-9/8 for "flash pulmonary edema". RN verified pt's admission was Hypertension Urgency with respiratory failure with hypoxia and [facility]. Pt reports her Lasix was increased to 20 mg at night but she is really tired and just does not feel like reviewing her discharge sheet at this time when requested. Pt states she has a scheduled office visit with her primary care provider this week but not sure of the specifics. RN offered to follow up at a more convenient time as pt requested this Friday afternoon. RN strongly encourage pt to monitor her fluid intake and possible symptoms that may reoccur. RN also requested to to have her discharge orders with medications for review of this information on the scheduled call. Will reiterated on the plan of care and re-evaluate goals at that time. Pt also reports [facility] remains involved with one additional visit with [facility] RN this week but PT/OT have finished. RN will follow up later in this week as requested.  [name], RN Care Management Coordinator [name] Office [phone]

## 2017-07-11 DIAGNOSIS — J9811 Atelectasis: Secondary | ICD-10-CM | POA: Diagnosis not present

## 2017-07-11 DIAGNOSIS — R05 Cough: Secondary | ICD-10-CM | POA: Diagnosis not present

## 2017-07-12 DIAGNOSIS — R195 Other fecal abnormalities: Secondary | ICD-10-CM | POA: Diagnosis not present

## 2017-07-12 DIAGNOSIS — D649 Anemia, unspecified: Secondary | ICD-10-CM | POA: Diagnosis not present

## 2017-07-13 ENCOUNTER — Other Ambulatory Visit: Payer: Self-pay | Admitting: *Deleted

## 2017-07-13 ENCOUNTER — Encounter: Payer: Self-pay | Admitting: *Deleted

## 2017-07-13 NOTE — Patient Outreach (Signed)
[place] [facility]) Care Management  07/13/2017  Kristen Krause 1941-09-22 [id number]   Transition of care  RN spoke with pt today and confirmed identifiers. RN inquired on medication sheet as planned to review today. Pt agreed and recited all her medications and confirmed supplies with no reported issues. States he had a clinic visit and was treated for with one week of medication for bronchitis. Pt reports this is much better at this time. RN inquired further on pt's management of care related to the plan of care and goals discussed today for HF. Pt reports her weight today and yesterday at 218.1 lbs and last week at 223 lbs with no swelling or breathing issues. RN confirmed pt remains in the GREEN zone today with no reported symptoms. RN review the HF zones and will extend her goal to allow adherence due to her recent illness but will continue transition of care calls as allowed for ongoing weekly follow up. Will continue to stress if pt is in favor of community home visits RN can extend a home visit for a more one-on-one education if needed. Pt would like to at this time ongoing transition of care contacts. RN strongly encourage pt to continue her daily weights and again report any symptoms of fluid retention to her provider along with any other symptoms that are related to her HF (pt with clear understanding). Pt reports she continues to be involved with [facility] visits and continues to do well with that progress. Will again review the plan of care along with all goals and reiterated on how to obtain these goals with her management of care. No request or inquires at this time as RN will continue to schedule weekly transition of care contacts accordingly.  [name], RN Care Management Coordinator [place] Office [phone]

## 2017-07-18 DIAGNOSIS — D649 Anemia, unspecified: Secondary | ICD-10-CM | POA: Diagnosis not present

## 2017-07-18 DIAGNOSIS — F329 Major depressive disorder, single episode, unspecified: Secondary | ICD-10-CM | POA: Diagnosis not present

## 2017-07-18 DIAGNOSIS — E039 Hypothyroidism, unspecified: Secondary | ICD-10-CM | POA: Diagnosis not present

## 2017-07-18 DIAGNOSIS — F418 Other specified anxiety disorders: Secondary | ICD-10-CM | POA: Diagnosis not present

## 2017-07-20 ENCOUNTER — Other Ambulatory Visit: Payer: Self-pay | Admitting: *Deleted

## 2017-07-20 NOTE — Patient Outreach (Signed)
[place] [facility]) Care Management  07/20/2017  Kristen Krause Jan 24, 1941 [id number]   RN spoke with pt today and verified identifiers and completed a transition of care inquiry. Pt states she has visited her provider and prescribed Zoloft 25 mg daily to help with her aniexty concerning her recent hospitalization with "flash pulmonary edema". RN further inquired if pt was in needed of counseling however pt states her provider did offer her this option but opt to decline at this time. RN reviewed her plan of care and goals generated as pt continues to verify she is in the GREEN zone with her HF and without symptoms of trouble breathing and no fluid retention reported. Pt states she has weighed the same over the last two days at 218. 7 lbs and 5 days ago weighed 217.3 lbs. Pt also verified she continues to received [facility] for PT/OT services with no further complains or reported issues. RN reviewed the plan of care/goals and verified pt continues to attend all her medical appointments and take all her medications with no problems. Will continue to encouraged adherence to the plan discussed with any needed resources.  RN continues to extend the offer for community home visits if needed to engage further with her medical issues however pt only receptive to the ongoing transition of care calls at this time. RN offered to continue follow up calls and call over the next few weeks along with any needed resources (pt agreed with no needs at this time). RN will scheduled a call next week accordingly   Kristen Mina, RN Care Management Coordinator [place] Office [phone]

## 2017-07-23 ENCOUNTER — Ambulatory Visit: Payer: Self-pay | Admitting: *Deleted

## 2017-07-25 ENCOUNTER — Encounter: Payer: Self-pay | Admitting: *Deleted

## 2017-07-26 ENCOUNTER — Ambulatory Visit: Payer: Self-pay | Admitting: *Deleted

## 2017-07-28 DIAGNOSIS — G4733 Obstructive sleep apnea (adult) (pediatric): Secondary | ICD-10-CM | POA: Diagnosis not present

## 2017-07-30 ENCOUNTER — Other Ambulatory Visit: Payer: Self-pay | Admitting: *Deleted

## 2017-07-30 NOTE — Patient Outreach (Signed)
[place] [facility]) Care Management  07/30/2017  Kristen Krause 06-21-1941 [id number]   RN spoke with pt today and verified identifiers. RN inquired on pt's management of care regarding her HF. Pt reports her weights yesterday 216.6, today 216.8 and last week 217.7 lbs. Pt reports she remains with [facility] with PT/OT and adherence with her medications and all medical appointments. RN contiune to review the plan of care and offered again one-on-one home visits however pt continues to remain adherent with the ongoing telephonic transition of care calls. Pt states she continues to use her CPAP and has been increasing her exercises to improve her health. No issues reported as pt remains in the GREEN zone and continues to understanding when to report any precipitating symptoms that are not controlled. RN continue to inquire on ongoing follow up calls and will reach out to pt next week and once again review an update the current plan of care.  [name], RN Care Management Coordinator [place] Office [phone]

## 2017-08-01 DIAGNOSIS — K922 Gastrointestinal hemorrhage, unspecified: Secondary | ICD-10-CM | POA: Diagnosis not present

## 2017-08-01 DIAGNOSIS — D62 Acute posthemorrhagic anemia: Secondary | ICD-10-CM | POA: Diagnosis not present

## 2017-08-01 DIAGNOSIS — J381 Polyp of vocal cord and larynx: Secondary | ICD-10-CM | POA: Diagnosis not present

## 2017-08-01 DIAGNOSIS — E039 Hypothyroidism, unspecified: Secondary | ICD-10-CM | POA: Diagnosis not present

## 2017-08-01 DIAGNOSIS — N183 Chronic kidney disease, stage 3 (moderate): Secondary | ICD-10-CM | POA: Diagnosis not present

## 2017-08-01 DIAGNOSIS — I130 Hypertensive heart and chronic kidney disease with heart failure and stage 1 through stage 4 chronic kidney disease, or unspecified chronic kidney disease: Secondary | ICD-10-CM | POA: Diagnosis not present

## 2017-08-01 DIAGNOSIS — Z7901 Long term (current) use of anticoagulants: Secondary | ICD-10-CM | POA: Diagnosis not present

## 2017-08-01 DIAGNOSIS — I482 Chronic atrial fibrillation: Secondary | ICD-10-CM | POA: Diagnosis not present

## 2017-08-01 DIAGNOSIS — Z950 Presence of cardiac pacemaker: Secondary | ICD-10-CM | POA: Diagnosis not present

## 2017-08-01 DIAGNOSIS — I5033 Acute on chronic diastolic (congestive) heart failure: Secondary | ICD-10-CM | POA: Diagnosis not present

## 2017-08-01 DIAGNOSIS — Z86711 Personal history of pulmonary embolism: Secondary | ICD-10-CM | POA: Diagnosis not present

## 2017-08-01 DIAGNOSIS — Z8614 Personal history of Methicillin resistant Staphylococcus aureus infection: Secondary | ICD-10-CM | POA: Diagnosis not present

## 2017-08-03 DIAGNOSIS — N183 Chronic kidney disease, stage 3 (moderate): Secondary | ICD-10-CM | POA: Diagnosis not present

## 2017-08-08 ENCOUNTER — Other Ambulatory Visit: Payer: Self-pay | Admitting: *Deleted

## 2017-08-08 DIAGNOSIS — I1310 Hypertensive heart and chronic kidney disease without heart failure, with stage 1 through stage 4 chronic kidney disease, or unspecified chronic kidney disease: Secondary | ICD-10-CM | POA: Diagnosis not present

## 2017-08-08 DIAGNOSIS — I130 Hypertensive heart and chronic kidney disease with heart failure and stage 1 through stage 4 chronic kidney disease, or unspecified chronic kidney disease: Secondary | ICD-10-CM | POA: Diagnosis not present

## 2017-08-08 DIAGNOSIS — E669 Obesity, unspecified: Secondary | ICD-10-CM | POA: Diagnosis not present

## 2017-08-08 DIAGNOSIS — N183 Chronic kidney disease, stage 3 (moderate): Secondary | ICD-10-CM | POA: Diagnosis not present

## 2017-08-08 NOTE — Patient Outreach (Addendum)
[place] [facility]) Care Management  08/08/2017  Kristen Krause Oct 05, 1941 [id number]   HF:RN spoke with pt today and verified identifiers and inquired further on her ongoing management of care. Pt reports her weights over the last week with yesterday at 217.2 lbs, today 217.5 lbs and last week 217.5 lbs with no precipitating symptoms reported. Pt denies any swelling or issues with her breathing. Will also confirm pt remains in the GREEN zone(HF) with no acute issues.  MEDICATIONS: Will confirm pt administering all prescribed medications as directed with a sufficient supply and no needed refills. Will continue to encourage adherence to this goal that has been met.  MEDICAL APPOINTMENTS: Pt verified attendance to all her ongoing medical appointments with good reports provided and pt continues to have no needs concerning community resources at this time. Will continue to encouraged adherence and inform pt she has met that goal.  All other goals discussed with the plan of care and RN continues to provided the need education concerning the importance of the HF zones. Based upon pt's transition of care calls that have been good will follow up one additional time next month prior to case closure if pt continue to do well in managing her care.  [name], RN Care Management Coordinator [place] Office [phone]

## 2017-08-13 DIAGNOSIS — Z0000 Encounter for general adult medical examination without abnormal findings: Secondary | ICD-10-CM | POA: Diagnosis not present

## 2017-08-27 DIAGNOSIS — G4733 Obstructive sleep apnea (adult) (pediatric): Secondary | ICD-10-CM | POA: Diagnosis not present

## 2017-08-30 DIAGNOSIS — G4733 Obstructive sleep apnea (adult) (pediatric): Secondary | ICD-10-CM | POA: Diagnosis not present

## 2017-08-30 DIAGNOSIS — Z9989 Dependence on other enabling machines and devices: Secondary | ICD-10-CM | POA: Diagnosis not present

## 2017-08-30 DIAGNOSIS — D649 Anemia, unspecified: Secondary | ICD-10-CM | POA: Diagnosis not present

## 2017-08-31 DIAGNOSIS — R0982 Postnasal drip: Secondary | ICD-10-CM | POA: Diagnosis not present

## 2017-08-31 DIAGNOSIS — J387 Other diseases of larynx: Secondary | ICD-10-CM | POA: Diagnosis not present

## 2017-09-06 DIAGNOSIS — R140 Abdominal distension (gaseous): Secondary | ICD-10-CM | POA: Diagnosis not present

## 2017-09-06 DIAGNOSIS — D649 Anemia, unspecified: Secondary | ICD-10-CM | POA: Diagnosis not present

## 2017-09-07 ENCOUNTER — Other Ambulatory Visit: Payer: Self-pay | Admitting: *Deleted

## 2017-09-07 NOTE — Patient Outreach (Signed)
[place] [facility]) Care Management  09/07/2017  Kristen Krause 11-22-1940 [id number]    Transition of care contact unsuccessful however RN able to leave a HIPAA approved voice message requesting a call back. Will inquire further on pt's progress and re-evaluate care plan accordingly. Plans will be to discharge pt if she remains stable and able to manage her medical issues with no needs. Will rescheduled accordingly.  [name], RN Care Management Coordinator [place] Office [phone]

## 2017-09-11 ENCOUNTER — Encounter: Payer: Self-pay | Admitting: *Deleted

## 2017-09-11 ENCOUNTER — Other Ambulatory Visit: Payer: Self-pay | Admitting: *Deleted

## 2017-09-11 DIAGNOSIS — I5032 Chronic diastolic (congestive) heart failure: Secondary | ICD-10-CM | POA: Diagnosis not present

## 2017-09-11 NOTE — Patient Outreach (Signed)
[place] [facility]) Care Management  09/11/2017  Kristen Krause 29-May-1941 [id number]   Last call via transition of care (60 day)  RN spoke with pt and confirmed identifiers. Pt report she continues to do well with no major issues at this time. States her weighs for yesterday 211.9 lbs, today 211.4 and last week 213 lbs and denies any swelling. Pt able to confirm she remains in the GREEN zone with no residual symptoms reported. Pt has finished with [facility] and started pulmonary rehabilitation. Pt has her next follow up with her primary provided in December. Pt has been in attendance with all medical appointments and takes her medications as prescribed with no delays or issues at this time. No community resources needed at this time as pt continue to manage her care independently. Based upon all goals met pt will be graduating from the [facility] program and services. Pt receptive and appreciative for the weekly calls as she continue to manage her care.  All HF zones reviewed for pt's knowledge as pt stressed she was aware and able to recognize acute symptoms and report to her primary to prevent acute events from occurring. No other resources needed at this time as this case will be closed.  [name], RN Care Management Coordinator [place] Office [phone]

## 2017-09-14 DIAGNOSIS — D649 Anemia, unspecified: Secondary | ICD-10-CM | POA: Diagnosis not present

## 2017-09-17 DIAGNOSIS — Z950 Presence of cardiac pacemaker: Secondary | ICD-10-CM | POA: Diagnosis not present

## 2017-09-27 DIAGNOSIS — G4733 Obstructive sleep apnea (adult) (pediatric): Secondary | ICD-10-CM | POA: Diagnosis not present

## 2017-10-01 DIAGNOSIS — I5032 Chronic diastolic (congestive) heart failure: Secondary | ICD-10-CM | POA: Diagnosis not present

## 2017-10-05 DIAGNOSIS — D649 Anemia, unspecified: Secondary | ICD-10-CM | POA: Diagnosis not present

## 2017-10-16 DIAGNOSIS — F419 Anxiety disorder, unspecified: Secondary | ICD-10-CM | POA: Diagnosis not present

## 2017-10-16 DIAGNOSIS — I110 Hypertensive heart disease with heart failure: Secondary | ICD-10-CM | POA: Diagnosis not present

## 2017-10-16 DIAGNOSIS — D649 Anemia, unspecified: Secondary | ICD-10-CM | POA: Diagnosis not present

## 2017-10-16 DIAGNOSIS — F329 Major depressive disorder, single episode, unspecified: Secondary | ICD-10-CM | POA: Diagnosis not present

## 2017-10-18 DIAGNOSIS — D649 Anemia, unspecified: Secondary | ICD-10-CM | POA: Diagnosis not present

## 2017-10-18 DIAGNOSIS — D509 Iron deficiency anemia, unspecified: Secondary | ICD-10-CM | POA: Diagnosis not present

## 2017-10-19 DIAGNOSIS — Z8719 Personal history of other diseases of the digestive system: Secondary | ICD-10-CM | POA: Diagnosis not present

## 2017-10-19 DIAGNOSIS — D500 Iron deficiency anemia secondary to blood loss (chronic): Secondary | ICD-10-CM | POA: Diagnosis not present

## 2017-10-26 DIAGNOSIS — D500 Iron deficiency anemia secondary to blood loss (chronic): Secondary | ICD-10-CM | POA: Diagnosis not present

## 2017-10-26 DIAGNOSIS — Z8719 Personal history of other diseases of the digestive system: Secondary | ICD-10-CM | POA: Diagnosis not present

## 2017-10-27 DIAGNOSIS — G4733 Obstructive sleep apnea (adult) (pediatric): Secondary | ICD-10-CM | POA: Diagnosis not present

## 2021-12-10 ENCOUNTER — Encounter (HOSPITAL_BASED_OUTPATIENT_CLINIC_OR_DEPARTMENT_OTHER): Payer: Self-pay

## 2021-12-10 ENCOUNTER — Other Ambulatory Visit: Payer: Self-pay

## 2021-12-10 ENCOUNTER — Emergency Department (HOSPITAL_BASED_OUTPATIENT_CLINIC_OR_DEPARTMENT_OTHER)
Admission: EM | Admit: 2021-12-10 | Discharge: 2021-12-10 | Disposition: A | Discharge status: Home / Self Care | Payer: Medicare Other | Attending: Emergency Medicine | Admitting: Emergency Medicine

## 2021-12-10 DIAGNOSIS — M2669 Other specified disorders of temporomandibular joint: Secondary | ICD-10-CM | POA: Diagnosis not present

## 2021-12-10 DIAGNOSIS — Z7901 Long term (current) use of anticoagulants: Secondary | ICD-10-CM | POA: Insufficient documentation

## 2021-12-10 DIAGNOSIS — Z79899 Other long term (current) drug therapy: Secondary | ICD-10-CM | POA: Insufficient documentation

## 2021-12-10 DIAGNOSIS — R6884 Jaw pain: Secondary | ICD-10-CM | POA: Diagnosis present

## 2021-12-10 DIAGNOSIS — M26609 Unspecified temporomandibular joint disorder, unspecified side: Secondary | ICD-10-CM

## 2021-12-10 MED ORDER — HYDROCODONE-ACETAMINOPHEN 5-325 MG PO TABS
1.0000 | ORAL_TABLET | Freq: Once | ORAL | Status: AC
  Administered 2021-12-10: 1 via ORAL
  Filled 2021-12-10: qty 1

## 2021-12-10 MED ORDER — HYDROCODONE-ACETAMINOPHEN 5-325 MG PO TABS: 1.0000 | ORAL_TABLET | Freq: Four times a day (QID) | ORAL | 0 refills | Status: AC | PRN

## 2021-12-10 NOTE — ED Triage Notes (Signed)
Pt to er, pt states that yesterday she started having some R jaw pain, pt states that it is her TMJ, states that she doesn't have a doctor here, states that her last issue with the tmj was in 2004.  Pt talking in full sentences, pt holding her R face.

## 2021-12-10 NOTE — Discharge Instructions (Addendum)
You were seen in the emergency department for evaluation of right jaw and ear pain.  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  There is no obvious signs of infection.  We are prescribing a short course of some pain medication.  Please use a soft diet.  Follow-up with ENT.  Return if any worsening or concerning symptoms

## 2021-12-10 NOTE — ED Provider Notes (Signed)
[facility] [place] [facility] Provider Note   CSN: [id number] Arrival date & time: 12/10/21  8841     History  Chief Complaint  Patient presents with   Dental Pain    Kristen Krause is a 81 y.o. female.  She is here with a complaint of right jaw pain that started last evening.  No trauma.  History of TMJ disorder.  Has not bothered her a long time.  Denies any dental pain.  No fevers chills nausea vomiting difficulty swallowing.  No numbness or weakness.  Pain is worse with moving jaw.  No hearing change.  Pain is 8 out of 10, has tried nothing for it.  The history is provided by the patient.      Home Medications Prior to Admission medications   Medication Sig Start Date End Date Taking? Authorizing Provider  amiodarone (PACERONE) 100 MG tablet Take 100 mg by mouth daily. Evening dosage    [provider]  amiodarone (PACERONE) 200 MG tablet Take 200 mg by mouth daily. Morning dosage    [provider]  amLODipine (NORVASC) 5 MG tablet Take 5 mg by mouth daily.    [provider]  apixaban (ELIQUIS) 5 MG TABS tablet Take 5 mg by mouth 2 (two) times daily.    [provider]  diltiazem (CARDIZEM CD) 180 MG 24 hr capsule Take 180 mg by mouth daily.    [provider]  furosemide (LASIX) 20 MG tablet Take 40 mg by mouth daily. AM dosage (40 MG) AND PM dosage (20 MG)    [provider]  ipratropium (ATROVENT) 0.06 % nasal spray Place 2 sprays into both nostrils daily as needed for rhinitis. Use every night    [provider]  lansoprazole (PREVACID) 30 MG capsule Take 1 capsule (30 mg total) by mouth daily at 12 noon. Patient not taking: Reported on 06/20/2017 04/17/16   Julianne Rice, MD  levothyroxine (SYNTHROID, LEVOTHROID) 100 MCG tablet Take 100 mcg by mouth daily before breakfast.    [provider]  levothyroxine (SYNTHROID, LEVOTHROID) 300 MCG tablet Take 300 mcg by mouth daily before breakfast.  20) mcg AM and 100 mcg PM    [provider]  levothyroxine (SYNTHROID, LEVOTHROID) 88 MCG tablet Take 88 mcg by mouth daily before breakfast.    [provider]  losartan (COZAAR) 100 MG tablet Take 100 mg by mouth daily.    [provider]  magnesium oxide (MAG-OX) 400 MG tablet Take 400 mg by mouth daily.    [provider]  metoprolol succinate (TOPROL-XL) 25 MG 24 hr tablet Take 25 mg by mouth daily.    [provider]  metoprolol tartrate (LOPRESSOR) 50 MG tablet Take 50 mg by mouth 2 (two) times daily.    [provider]  Multiple Vitamin (MULTIVITAMIN) tablet Take 1 tablet by mouth daily.    [provider]  pantoprazole (PROTONIX) 40 MG tablet Take 40 mg by mouth 2 (two) times daily.    [provider]  potassium chloride (K-DUR,KLOR-CON) 10 MEQ tablet Take 10 mEq by mouth daily.    [provider]  QUEtiapine (SEROQUEL) 25 MG tablet Take 25 mg by mouth at bedtime.    [provider]  rivaroxaban (XARELTO) 10 MG TABS tablet Take 20 mg by mouth daily.     [provider]  sertraline (ZOLOFT) 25 MG tablet Take 25 mg by mouth daily.    [provider]  vitamin E (VITAMIN  E) 1000 UNIT capsule Take 1,000 Units by mouth daily. Ordered 3000 units daily    [provider]      Allergies    Ceftin [cefuroxime]    Review of Systems   Review of Systems  Constitutional:  Negative for fever.  HENT:  Negative for dental problem, hearing loss and sore throat.   Eyes:  Negative for visual disturbance.  Respiratory:  Negative for shortness of breath.   Cardiovascular:  Negative for chest pain.  Gastrointestinal:  Negative for abdominal pain.  Genitourinary:  Negative for dysuria.  Musculoskeletal:  Negative for neck pain.  Skin:  Negative for rash.  Neurological:  Negative for headaches.   Physical Exam Updated Vital Signs BP (!) 124/95 (BP Location: Left Arm)    Pulse 80     Temp 97.9 F (36.6 C)    Resp 18    Ht _0  (1.626 m)    Wt 90.7 kg    SpO2 94%    BMI 34.33 kg/m  Physical Exam Constitutional:      Appearance: Normal appearance. She is well-developed.  HENT:     Head: Normocephalic and atraumatic.     Right Ear: Tympanic membrane normal.     Left Ear: Tympanic membrane normal.     Nose: Nose normal.     Mouth/Throat:     Mouth: Mucous membranes are moist.     Pharynx: Oropharynx is clear.     Comments: She is tender over her right TM J.  There is no palpable mass or clicking appreciated with range of motion of jaw.  No malocclusion.  Fair dentition no obvious abscess. Eyes:     Conjunctiva/sclera: Conjunctivae normal.  Cardiovascular:     Rate and Rhythm: Normal rate.  Musculoskeletal:     Cervical back: Neck supple.  Skin:    General: Skin is warm and dry.  Neurological:     General: No focal deficit present.     Mental Status: She is alert.     GCS: GCS eye subscore is 4. GCS verbal subscore is 5. GCS motor subscore is 6.     Cranial Nerves: No cranial nerve deficit.    ED Results / Procedures / Treatments   Labs (all labs ordered are listed, but only abnormal results are displayed) Labs Reviewed - No data to display  EKG None  Radiology No results found.  Procedures Procedures    Medications Ordered in ED Medications  HYDROcodone-acetaminophen (NORCO/VICODIN) 5-325 MG per tablet 1 tablet (has no administration in time range)    ED Course/ Medical Decision Making/ A&P                           Medical Decision Making Risk Prescription drug management.  81 year old female here with right jaw and TMJ pain.  History of same.  No trauma no fever no dental pain.  Given pain medication with improvement in her symptoms.  Differential includes TMJ symptoms, subluxation, dislocation, dental abscess.  No clinical findings consistent with infection.  Will cover symptomatically with pain medication.  Patient should not use NSAIDs  due to her being on anticoagulation.  Given contact information for ENT.  No indications for further imaging or admission at this time.  Return instructions discussed        Final Clinical Impression(s) / ED Diagnoses Final diagnoses:  TMJ (temporomandibular joint disorder)    Rx / DC Orders ED Discharge Orders  Ordered    HYDROcodone-acetaminophen (NORCO/VICODIN) 5-325 MG tablet  Every 6 hours PRN        12/10/21 0927              Hayden Rasmussen, MD 12/10/21 1722

## 2021-12-20 ENCOUNTER — Encounter (HOSPITAL_BASED_OUTPATIENT_CLINIC_OR_DEPARTMENT_OTHER): Payer: Self-pay | Admitting: *Deleted

## 2021-12-20 ENCOUNTER — Other Ambulatory Visit: Payer: Self-pay

## 2021-12-20 ENCOUNTER — Emergency Department (HOSPITAL_BASED_OUTPATIENT_CLINIC_OR_DEPARTMENT_OTHER)
Admission: EM | Admit: 2021-12-20 | Discharge: 2021-12-20 | Disposition: A | Discharge status: Home / Self Care | Payer: Medicare Other | Attending: Emergency Medicine | Admitting: Emergency Medicine

## 2021-12-20 DIAGNOSIS — Z7901 Long term (current) use of anticoagulants: Secondary | ICD-10-CM | POA: Diagnosis not present

## 2021-12-20 DIAGNOSIS — R21 Rash and other nonspecific skin eruption: Secondary | ICD-10-CM

## 2021-12-20 DIAGNOSIS — B029 Zoster without complications: Secondary | ICD-10-CM | POA: Diagnosis not present

## 2021-12-20 DIAGNOSIS — I10 Essential (primary) hypertension: Secondary | ICD-10-CM | POA: Diagnosis not present

## 2021-12-20 DIAGNOSIS — I4891 Unspecified atrial fibrillation: Secondary | ICD-10-CM | POA: Diagnosis not present

## 2021-12-20 DIAGNOSIS — Z79899 Other long term (current) drug therapy: Secondary | ICD-10-CM | POA: Insufficient documentation

## 2021-12-20 MED ORDER — VALACYCLOVIR HCL 1 G PO TABS: 1000.0000 mg | ORAL_TABLET | Freq: Three times a day (TID) | ORAL | 0 refills | Status: AC

## 2021-12-20 NOTE — ED Triage Notes (Signed)
Rash on the back of her neck and right shoulder for a week.

## 2021-12-20 NOTE — ED Provider Notes (Signed)
[facility] Provider Note   CSN: [id number] Arrival date & time: 12/20/21  1230     History  Chief Complaint  Patient presents with   Rash    Kristen Krause is a 81 y.o. female.  Patient with a rash on the right upper back and right upper chest area now for 7 days.  Painful burning at times.  Patient states the rash was present when she was seen February 11.  When evaluated for TMJ.  But did not bring it to the doctor's attention.  Patient has not had the shingles vaccine.  Past medical history significant for hypertension atrial fibrillation.   Patient is on Eliquis.      Home Medications Prior to Admission medications   Medication Sig Start Date End Date Taking? Authorizing Provider  valACYclovir (VALTREX) 1000 MG tablet Take 1 tablet (1,000 mg total) by mouth 3 (three) times daily. 12/20/21  Yes Fredia Sorrow, MD  amiodarone (PACERONE) 100 MG tablet Take 100 mg by mouth daily. Evening dosage    [provider]  amiodarone (PACERONE) 200 MG tablet Take 200 mg by mouth daily. Morning dosage    [provider]  amLODipine (NORVASC) 5 MG tablet Take 5 mg by mouth daily.    [provider]  apixaban (ELIQUIS) 5 MG TABS tablet Take 5 mg by mouth 2 (two) times daily.    [provider]  diltiazem (CARDIZEM CD) 180 MG 24 hr capsule Take 180 mg by mouth daily.    [provider]  furosemide (LASIX) 20 MG tablet Take 40 mg by mouth daily. AM dosage (40 MG) AND PM dosage (20 MG)    [provider]  HYDROcodone-acetaminophen (NORCO/VICODIN) 5-325 MG tablet Take 1 tablet by mouth every 6 (six) hours as needed. 12/10/21   Hayden Rasmussen, MD  ipratropium (ATROVENT) 0.06 % nasal spray Place 2 sprays into both nostrils daily as needed for rhinitis. Use every night    [provider]  lansoprazole (PREVACID) 30 MG capsule Take 1 capsule (30 mg total) by mouth daily at 12 noon. Patient not taking:  Reported on 06/20/2017 04/17/16   Julianne Rice, MD  levothyroxine (SYNTHROID, LEVOTHROID) 100 MCG tablet Take 100 mcg by mouth daily before breakfast.    [provider]  levothyroxine (SYNTHROID, LEVOTHROID) 300 MCG tablet Take 300 mcg by mouth daily before breakfast. 20) mcg AM and 100 mcg PM    [provider]  levothyroxine (SYNTHROID, LEVOTHROID) 88 MCG tablet Take 88 mcg by mouth daily before breakfast.    [provider]  losartan (COZAAR) 100 MG tablet Take 100 mg by mouth daily.    [provider]  magnesium oxide (MAG-OX) 400 MG tablet Take 400 mg by mouth daily.    [provider]  metoprolol succinate (TOPROL-XL) 25 MG 24 hr tablet Take 25 mg by mouth daily.    [provider]  metoprolol tartrate (LOPRESSOR) 50 MG tablet Take 50 mg by mouth 2 (two) times daily.    [provider]  Multiple Vitamin (MULTIVITAMIN) tablet Take 1 tablet by mouth daily.    [provider]  pantoprazole (PROTONIX) 40 MG tablet Take 40 mg by mouth 2 (two) times daily.    [provider]  potassium chloride (K-DUR,KLOR-CON) 10 MEQ tablet Take 10 mEq by mouth daily.    [provider]  QUEtiapine (SEROQUEL) 25 MG tablet Take 25 mg by mouth at bedtime.    [provider]  rivaroxaban (XARELTO) 10 MG TABS tablet Take 20 mg by mouth daily.     [provider]  sertraline (ZOLOFT) 25 MG tablet Take 25 mg by mouth daily.    [provider]  vitamin E (VITAMIN E) 1000 UNIT capsule Take 1,000 Units by mouth daily. Ordered 3000 units daily    [provider]      Allergies    Ceftin [cefuroxime]    Review of Systems   Review of Systems  Constitutional:  Negative for chills and fever.  HENT:  Negative for ear pain and sore throat.   Eyes:  Negative for pain and visual disturbance.  Respiratory:  Negative for cough and shortness of breath.   Cardiovascular:  Negative for chest pain and  palpitations.  Gastrointestinal:  Negative for abdominal pain and vomiting.  Genitourinary:  Negative for dysuria and hematuria.  Musculoskeletal:  Negative for arthralgias and back pain.  Skin:  Positive for rash. Negative for color change.  Neurological:  Negative for seizures and syncope.  All other systems reviewed and are negative.  Physical Exam Updated Vital Signs BP 133/82 (BP Location: Right Arm)    Pulse 78    Temp 98 F (36.7 C) (Oral)    Resp 18    Ht 1.626 m (_0 )    Wt 90.4 kg    SpO2 97%    BMI 34.21 kg/m  Physical Exam Vitals and nursing note reviewed.  Constitutional:      General: She is not in acute distress.    Appearance: Normal appearance. She is well-developed.  HENT:     Head: Normocephalic and atraumatic.  Eyes:     Extraocular Movements: Extraocular movements intact.     Conjunctiva/sclera: Conjunctivae normal.     Pupils: Pupils are equal, round, and reactive to light.  Cardiovascular:     Rate and Rhythm: Normal rate and regular rhythm.     Heart sounds: No murmur heard. Pulmonary:     Effort: Pulmonary effort is normal. No respiratory distress.     Breath sounds: Normal breath sounds.  Abdominal:     Palpations: Abdomen is soft.     Tenderness: There is no abdominal tenderness.  Musculoskeletal:        General: No swelling.     Cervical back: Normal range of motion and neck supple.     Comments: Right upper extremity radial pulses 2+.  No rash on the upper extremity.  Skin:    General: Skin is warm and dry.     Capillary Refill: Capillary refill takes less than 2 seconds.     Findings: Rash present.     Comments: Patient with a vesicular rash some of the scabbing.  Some of his pain is corrugated by itching.  It does not cross the midline anteriorly or posteriorly.  Seems to be upper thoracic nerve roots.  No secondary infection.  Not on the face or neck area.  Also not going down the right arm.  Neurological:     General: No focal deficit  present.     Mental Status: She is alert and oriented to person, place, and time.     Cranial Nerves: No cranial nerve deficit.     Sensory: No sensory deficit.  Psychiatric:        Mood and Affect: Mood normal.    ED Results / Procedures / Treatments   Labs (all labs ordered are listed, but only abnormal results are displayed) Labs Reviewed - No data  to display  EKG None  Radiology No results found.  Procedures Procedures    Medications Ordered in ED Medications - No data to display  ED Course/ Medical Decision Making/ A&P                           Medical Decision Making  Consistent with herpes zoster.  Shingles upper thoracic dermatome.  Symptoms have been ongoing now for over 7 days.  Consider were present when she was seen for TMJ concerns on February 11.  Probably no benefit to starting antiviral at this point in time.  However we will give her a prescription that if things get worse she can start it.  We will have patient follow-up with her primary care doctor.  Also consideration for the shingles vaccine in the future.   Final Clinical Impression(s) / ED Diagnoses Final diagnoses:  Rash  Herpes zoster without complication    Rx / DC Orders ED Discharge Orders          Ordered    valACYclovir (VALTREX) 1000 MG tablet  3 times daily        12/20/21 1423              Fredia Sorrow, MD 12/20/21 1423

## 2021-12-20 NOTE — Discharge Instructions (Signed)
Make an appointment follow-up with your primary care doctor to have the shingles rechecked.  Seems to be starting to heal.  But sometimes it can be present for a longer period of time.  Prescription for the antiviral provided if you feel things are getting worse you can start it.  But most likely after this been present for about 10 days is not going to be too much help.
